# Patient Record
Sex: Female | Born: 1950 | Race: White | Hispanic: No | Marital: Married | Smoking: Never smoker
Health system: Southern US, Community
[De-identification: ages and names within clinical notes are randomized; demographics above are authoritative.]

## PROBLEM LIST (undated history)

## (undated) DIAGNOSIS — Z8042 Family history of malignant neoplasm of prostate: Secondary | ICD-10-CM

## (undated) DIAGNOSIS — Z803 Family history of malignant neoplasm of breast: Secondary | ICD-10-CM

## (undated) DIAGNOSIS — I10 Essential (primary) hypertension: Secondary | ICD-10-CM

## (undated) DIAGNOSIS — Z801 Family history of malignant neoplasm of trachea, bronchus and lung: Secondary | ICD-10-CM

## (undated) HISTORY — PX: TONSILLECTOMY AND ADENOIDECTOMY: SUR1326

## (undated) HISTORY — DX: Family history of malignant neoplasm of breast: Z80.3

## (undated) HISTORY — DX: Family history of malignant neoplasm of prostate: Z80.42

## (undated) HISTORY — PX: PARTIAL HYSTERECTOMY: SHX80

## (undated) HISTORY — DX: Family history of malignant neoplasm of trachea, bronchus and lung: Z80.1

## (undated) HISTORY — DX: Essential (primary) hypertension: I10

---

## 2019-08-14 ENCOUNTER — Other Ambulatory Visit: Payer: Self-pay | Admitting: Family

## 2019-08-14 DIAGNOSIS — N6310 Unspecified lump in the right breast, unspecified quadrant: Secondary | ICD-10-CM

## 2019-08-21 ENCOUNTER — Other Ambulatory Visit: Payer: Self-pay

## 2019-08-21 ENCOUNTER — Ambulatory Visit
Admission: RE | Admit: 2019-08-21 | Discharge: 2019-08-21 | Disposition: A | Discharge status: Home / Self Care | Payer: Medicare Other | Source: Ambulatory Visit | Attending: Family | Admitting: Family

## 2019-08-21 ENCOUNTER — Other Ambulatory Visit: Payer: Self-pay | Admitting: Family

## 2019-08-21 DIAGNOSIS — N6310 Unspecified lump in the right breast, unspecified quadrant: Secondary | ICD-10-CM

## 2019-08-23 ENCOUNTER — Other Ambulatory Visit: Payer: Self-pay | Admitting: *Deleted

## 2019-08-23 ENCOUNTER — Telehealth: Payer: Self-pay | Admitting: Hematology and Oncology

## 2019-08-23 ENCOUNTER — Encounter: Payer: Self-pay | Admitting: *Deleted

## 2019-08-23 DIAGNOSIS — C50411 Malignant neoplasm of upper-outer quadrant of right female breast: Secondary | ICD-10-CM

## 2019-08-23 DIAGNOSIS — Z170 Estrogen receptor positive status [ER+]: Secondary | ICD-10-CM | POA: Insufficient documentation

## 2019-08-23 NOTE — Telephone Encounter (Signed)
Emailed patient to confirm morning [facility] appointment for 11/11, packet emailed to patient

## 2019-08-27 NOTE — Progress Notes (Signed)
[place] NOTE  Patient Care Team: [name], NP as PCP - General (Internal Medicine) Mauro Kaufmann, RN as Oncology Nurse Navigator Rockwell Germany, RN as Oncology Nurse Navigator Nicholas Lose, MD as Consulting Physician (Hematology and Oncology) Gery Pray, MD as Consulting Physician (Radiation Oncology) Alphonsa Overall, MD as Consulting Physician (General Surgery)  CHIEF COMPLAINTS/PURPOSE OF CONSULTATION:  Newly diagnosed breast cancer  HISTORY OF PRESENTING ILLNESS:  Jasmine Sosa 68 y.o. female is here because of recent diagnosis of invasive mammary carcinoma of the right breast. The cancer was detected on a routine screening mammogram on 08/01/19 and was no palpable prior to diagnosis. Diagnostic mammogram and [place] on 08/13/19 showed a 0.8cm mass in the 8:45 position of the right breast with no abnormal right axillary lymph nodes. Biopsy on 08/21/19 showed invasive and in situ mammary carcinoma, grade 1, HER-2 negative (1+), ER+ 90%, PR+ 85%, Ki67 5%. She presents to the clinic today for initial evaluation and discussion of treatment options.   I reviewed her records extensively and collaborated the history with the patient.  SUMMARY OF ONCOLOGIC HISTORY: Oncology History  Malignant neoplasm of upper-outer quadrant of right breast in female, estrogen receptor positive ([place])  08/23/2019 Initial Diagnosis   Routine screening mammogram detected 0.8cm mass in the 8:45 position of the right breast, no abnormal right axillary lymph nodes. 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      MEDICAL HISTORY:  Past Medical History:  Diagnosis Date   Hypertension     SURGICAL HISTORY: Past Surgical History:  Procedure Laterality Date   PARTIAL HYSTERECTOMY     TONSILLECTOMY AND ADENOIDECTOMY      SOCIAL HISTORY: Social History   Socioeconomic History   Marital status: Married   Spouse name: Not on file   Number of children: Not on file   Years of education: Not on file   Highest education level: Not on file  Occupational History   Not on file  Social Needs   Financial resource strain: Not on file   Food insecurity    Worry: Not on file    Inability: Not on file   Transportation needs    Medical: Not on file    Non-medical: Not on file  Tobacco Use   Smoking status: Never Smoker   Smokeless tobacco: Never Used  Substance and Sexual Activity   Alcohol use: Not Currently   Drug use: Never   Sexual activity: Not on file  Lifestyle   Physical activity    Days per week: Not on file    Minutes per session: Not on file   Stress: Not on file  Relationships   Social connections    Talks on phone: Not on file    Gets together: Not on file    Attends religious service: Not on file    Active member of club or organization: Not on file    Attends meetings of clubs or organizations: Not on file    Relationship status: Not on file   Intimate partner violence    Fear of current or ex partner: Not on file    Emotionally abused: Not on file    Physically abused: Not on file    Forced sexual activity: Not on file  Other Topics Concern   Not on file  Social History Narrative   Not on file    FAMILY HISTORY: Family History  Problem Relation Age of Onset   Breast cancer Father    Breast cancer Sister    Breast cancer Paternal Aunt    Lung cancer Paternal Uncle     ALLERGIES:  is allergic to ace inhibitors; penicillins; and sulfa antibiotics.  MEDICATIONS:  Current Outpatient Medications  Medication Sig Dispense Refill   Calcium Carb-Cholecalciferol (CALCIUM 600+D3 PO) Take by mouth.     levothyroxine (SYNTHROID) 75 MCG tablet Take 75 mcg by mouth daily before breakfast.     losartan (COZAAR) 100 MG tablet Take 100 mg by mouth daily.     No current facility-administered medications for this visit.     REVIEW OF SYSTEMS:     Constitutional: Denies fevers, chills or abnormal night sweats Eyes: Denies blurriness of vision, double vision or watery eyes Ears, nose, mouth, throat, and face: Denies mucositis or sore throat Respiratory: Denies cough, dyspnea or wheezes Cardiovascular: Denies palpitation, chest discomfort or lower extremity swelling Gastrointestinal:  Denies nausea, heartburn or change in bowel habits Skin: Denies abnormal skin rashes Lymphatics: Denies new lymphadenopathy or easy bruising Neurological:Denies numbness, tingling or new weaknesses Behavioral/Psych: Mood is stable, no new changes  Breast: Denies any palpable lumps or discharge All other systems were reviewed with the patient and are negative.  PHYSICAL EXAMINATION: ECOG PERFORMANCE STATUS: 1 - Symptomatic but completely ambulatory  Vitals:   08/28/19 0856  BP: (!) 147/79  Pulse: 68  Resp: 18  Temp: 98.5 F (36.9 C)  SpO2: 100%   Filed Weights   08/28/19 0856  Weight: 146 lb 6.4 oz (66.4 kg)    GENERAL:alert, no distress and comfortable SKIN: skin color, texture, turgor are normal, no rashes or significant lesions EYES: normal, conjunctiva are pink and non-injected, sclera clear OROPHARYNX:no exudate, no erythema and lips, buccal mucosa, and tongue normal  NECK: supple, thyroid normal size, non-tender, without nodularity LYMPH:  no palpable lymphadenopathy in the cervical, axillary or inguinal LUNGS: clear to auscultation and percussion with normal breathing effort HEART: regular rate & rhythm and no murmurs and no lower extremity edema ABDOMEN:abdomen soft, non-tender and normal bowel sounds Musculoskeletal:no cyanosis of digits and no clubbing  PSYCH: alert & oriented x 3 with fluent speech NEURO: no focal motor/sensory deficits BREAST: No palpable nodules in breast. No palpable axillary or supraclavicular lymphadenopathy (exam performed in the presence of a chaperone)   LABORATORY DATA:  I have reviewed the data as  listed Lab Results  Component Value Date   WBC 8.8 08/28/2019   HGB 13.3 08/28/2019   HCT 40.5 08/28/2019   MCV 91.2 08/28/2019   PLT 308 08/28/2019   Lab Results  Component Value Date   NA 141 08/28/2019   K 3.8 08/28/2019   CL 104 08/28/2019   CO2 26 08/28/2019    RADIOGRAPHIC STUDIES: I have personally reviewed the radiological reports and agreed with the findings in the report.  ASSESSMENT AND PLAN:  Malignant neoplasm of upper-outer quadrant of right breast in female, estrogen receptor positive ([place]) 08/23/2019:Routine screening mammogram detected 0.8cm mass in the 8:45 position of the right breast, no abnormal right axillary lymph nodes. 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  T1b N0 stage Ia clinical stage  Pathology and radiology counseling: Discussed with the patient, the details of pathology including the type of breast cancer,the clinical staging, the significance of ER, PR and HER-2/neu receptors and the implications for treatment. After reviewing the pathology in detail,  we proceeded to discuss the different treatment options between surgery, radiation, chemotherapy, antiestrogen therapies.  Treatment plan: 1.  Breast conserving surgery with sentinel lymph node biopsy 2.  Adjuvant radiation 3.  Follow-up adjuvant antiestrogen therapy  Based on favorable prognostic panel and grade 1 in the size being less than a centimeter I do not think she needs Oncotype DX testing. Return to clinic after surgery to discuss final pathology report and confirm the adjuvant treatment plan.   All questions were answered. The patient knows to call the clinic with any problems, questions or concerns.   Rulon Eisenmenger, MD, MPH 08/28/2019    I, Molly Dorshimer, am acting as scribe for Nicholas Lose, MD.  I have reviewed the above documentation for accuracy and completeness, and I agree with the above.

## 2019-08-28 ENCOUNTER — Ambulatory Visit (HOSPITAL_BASED_OUTPATIENT_CLINIC_OR_DEPARTMENT_OTHER): Payer: Medicare Other | Admitting: Genetic Counselor

## 2019-08-28 ENCOUNTER — Ambulatory Visit
Admission: RE | Admit: 2019-08-28 | Discharge: 2019-08-28 | Disposition: A | Discharge status: Home / Self Care | Payer: Medicare Other | Source: Ambulatory Visit | Attending: Radiation Oncology | Admitting: Radiation Oncology

## 2019-08-28 ENCOUNTER — Ambulatory Visit: Payer: Medicare Other | Attending: Surgery | Admitting: Physical Therapy

## 2019-08-28 ENCOUNTER — Inpatient Hospital Stay: Payer: Medicare Other

## 2019-08-28 ENCOUNTER — Other Ambulatory Visit: Payer: Self-pay

## 2019-08-28 ENCOUNTER — Encounter: Payer: Self-pay | Admitting: Physical Therapy

## 2019-08-28 ENCOUNTER — Inpatient Hospital Stay: Payer: Medicare Other | Attending: Hematology and Oncology | Admitting: Hematology and Oncology

## 2019-08-28 ENCOUNTER — Encounter: Payer: Self-pay | Admitting: Hematology and Oncology

## 2019-08-28 ENCOUNTER — Encounter: Payer: Self-pay | Admitting: Genetic Counselor

## 2019-08-28 ENCOUNTER — Other Ambulatory Visit: Payer: Self-pay | Admitting: Surgery

## 2019-08-28 DIAGNOSIS — G8929 Other chronic pain: Secondary | ICD-10-CM | POA: Diagnosis present

## 2019-08-28 DIAGNOSIS — Z170 Estrogen receptor positive status [ER+]: Secondary | ICD-10-CM | POA: Insufficient documentation

## 2019-08-28 DIAGNOSIS — C50911 Malignant neoplasm of unspecified site of right female breast: Secondary | ICD-10-CM

## 2019-08-28 DIAGNOSIS — Z801 Family history of malignant neoplasm of trachea, bronchus and lung: Secondary | ICD-10-CM | POA: Diagnosis not present

## 2019-08-28 DIAGNOSIS — C50411 Malignant neoplasm of upper-outer quadrant of right female breast: Secondary | ICD-10-CM

## 2019-08-28 DIAGNOSIS — R293 Abnormal posture: Secondary | ICD-10-CM | POA: Diagnosis present

## 2019-08-28 DIAGNOSIS — Z1379 Encounter for other screening for genetic and chromosomal anomalies: Secondary | ICD-10-CM

## 2019-08-28 DIAGNOSIS — Z8042 Family history of malignant neoplasm of prostate: Secondary | ICD-10-CM

## 2019-08-28 DIAGNOSIS — M25511 Pain in right shoulder: Secondary | ICD-10-CM | POA: Diagnosis present

## 2019-08-28 DIAGNOSIS — Z803 Family history of malignant neoplasm of breast: Secondary | ICD-10-CM

## 2019-08-28 DIAGNOSIS — C50511 Malignant neoplasm of lower-outer quadrant of right female breast: Secondary | ICD-10-CM | POA: Insufficient documentation

## 2019-08-28 LAB — CMP (CANCER CENTER ONLY)
ALT: 13 U/L (ref 0–44)
AST: 16 U/L (ref 15–41)
Albumin: 4.3 g/dL (ref 3.5–5.0)
Alkaline Phosphatase: 81 U/L (ref 38–126)
Anion gap: 11 (ref 5–15)
BUN: 16 mg/dL (ref 8–23)
CO2: 26 mmol/L (ref 22–32)
Calcium: 9.7 mg/dL (ref 8.9–10.3)
Chloride: 104 mmol/L (ref 98–111)
Creatinine: 0.79 mg/dL (ref 0.44–1.00)
GFR, Est AFR Am: >60 mL/min (ref >60)
GFR, Estimated: >60 mL/min (ref >60)
Glucose, Bld: 91 mg/dL (ref 70–99)
Potassium: 3.8 mmol/L (ref 3.5–5.1)
Sodium: 141 mmol/L (ref 135–145)
Total Bilirubin: 0.4 mg/dL (ref 0.3–1.2)
Total Protein: 7.3 g/dL (ref 6.5–8.1)

## 2019-08-28 LAB — CBC WITH DIFFERENTIAL (CANCER CENTER ONLY)
Abs Immature Granulocytes: 0.03 10*3/uL (ref 0.00–0.07)
Basophils Absolute: 0.1 10*3/uL (ref 0.0–0.1)
Basophils Relative: 1 %
Eosinophils Absolute: 0.2 10*3/uL (ref 0.0–0.5)
Eosinophils Relative: 2 %
HCT: 40.5 % (ref 36.0–46.0)
Hemoglobin: 13.3 g/dL (ref 12.0–15.0)
Immature Granulocytes: 0 %
Lymphocytes Relative: 38 %
Lymphs Abs: 3.3 10*3/uL (ref 0.7–4.0)
MCH: 30 pg (ref 26.0–34.0)
MCHC: 32.8 g/dL (ref 30.0–36.0)
MCV: 91.2 fL (ref 80.0–100.0)
Monocytes Absolute: 0.6 10*3/uL (ref 0.1–1.0)
Monocytes Relative: 7 %
Neutro Abs: 4.6 10*3/uL (ref 1.7–7.7)
Neutrophils Relative %: 52 %
Platelet Count: 308 10*3/uL (ref 150–400)
RBC: 4.44 MIL/uL (ref 3.87–5.11)
RDW: 12.6 % (ref 11.5–15.5)
WBC Count: 8.8 10*3/uL (ref 4.0–10.5)
nRBC: 0 % (ref 0.0–0.2)

## 2019-08-28 NOTE — Assessment & Plan Note (Signed)
08/23/2019:Routine screening mammogram detected 0.8cm mass in the 8:45 position of the right breast, no abnormal right axillary lymph nodes. 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  T1b N0 stage Ia clinical stage  Pathology and radiology counseling: Discussed with the patient, the details of pathology including the type of breast cancer,the clinical staging, the significance of ER, PR and HER-2/neu receptors and the implications for treatment. After reviewing the pathology in detail, we proceeded to discuss the different treatment options between surgery, radiation, chemotherapy, antiestrogen therapies.  Treatment plan: 1.  Breast conserving surgery with sentinel lymph node biopsy 2.  Adjuvant radiation 3.  Follow-up adjuvant antiestrogen therapy  Based on favorable prognostic panel and grade 1 in the size being less than a centimeter I do not think she needs Oncotype DX testing. Return to clinic after surgery to discuss final pathology report and confirm the adjuvant treatment plan.

## 2019-08-28 NOTE — Progress Notes (Signed)
Radiation Oncology         [phone] ________________________________  Multidisciplinary Breast Oncology Clinic [facility]) Initial Outpatient Consultation  Name: Jasmine Sosa MRN: [id number]  Date: 08/28/2019  DOB: 1951-06-29  IR:JJOAC, Felipa Evener, NP  Alphonsa Overall, MD   REFERRING PHYSICIAN: Alphonsa Overall, MD  DIAGNOSIS: The encounter diagnosis was Malignant neoplasm of upper-outer quadrant of right breast in female, estrogen receptor positive ([place]).  Stage 1A (T1b, CN0), Right Breast LOQ, Invasive Ductal Carcinoma, ER+ / PR+ / Her2-, Grade 1.    ICD-10-CM   1. Malignant neoplasm of upper-outer quadrant of right breast in female, estrogen receptor positive ([place])  C50.411    Z17.0     HISTORY OF PRESENT ILLNESS::Jasmine Sosa is a 68 y.o. female who is presenting to the office today for evaluation of her newly diagnosed breast cancer. She is accompanied by her husband. She is doing well overall.   She had routine screening mammography on 08/01/2019 showing a possible abnormality in the right breast. She underwent right diagnostic mammography with tomography and right breast ultrasonography at [facility] on 08/13/2019 showing 8 mm mass in the 8:45 o'clock position of the right breast with imaging features highly suspicious for malignancy.  Biopsy on 08/21/2019 showed invasive and in situ mammary carcinoma; E-cadherin positive. Prognostic indicators significant for: estrogen receptor, 90% positive and progesterone receptor, 85% positive, both with strong staining intensity. Proliferation marker Ki67 at 5%. HER2 positive.  Menarche: 68 years old Age at first live birth: 68 years old GP: 2 LMP: Unknown Contraceptive: Yes; used for approximately two years HRT: Yes; Estradiol for two weeks - stopped on 07/25/2019.   The patient was referred today for presentation in the multidisciplinary conference.  Radiology studies and pathology slides were presented there for review and  discussion of treatment options.  A consensus was discussed regarding potential next steps.  PREVIOUS RADIATION THERAPY: No  PAST MEDICAL HISTORY:  Past Medical History:  Diagnosis Date   Hypertension     PAST SURGICAL HISTORY: Past Surgical History:  Procedure Laterality Date   PARTIAL HYSTERECTOMY     TONSILLECTOMY AND ADENOIDECTOMY      FAMILY HISTORY:  Family History  Problem Relation Age of Onset   Breast cancer Father    Breast cancer Sister    Breast cancer Paternal Aunt    Lung cancer Paternal Uncle     SOCIAL HISTORY:  Social History   Socioeconomic History   Marital status: Married    Spouse name: Not on file   Number of children: Not on file   Years of education: Not on file   Highest education level: Not on file  Occupational History   Not on file  Social Needs   Financial resource strain: Not on file   Food insecurity    Worry: Not on file    Inability: Not on file   Transportation needs    Medical: Not on file    Non-medical: Not on file  Tobacco Use   Smoking status: Never Smoker   Smokeless tobacco: Never Used  Substance and Sexual Activity   Alcohol use: Not Currently   Drug use: Never   Sexual activity: Not on file  Lifestyle   Physical activity    Days per week: Not on file    Minutes per session: Not on file   Stress: Not on file  Relationships   Social connections    Talks on phone: Not on file    Gets together: Not on file  Attends religious service: Not on file    Active member of club or organization: Not on file    Attends meetings of clubs or organizations: Not on file    Relationship status: Not on file  Other Topics Concern   Not on file  Social History Narrative   Not on file    ALLERGIES:  Allergies  Allergen Reactions   Ace Inhibitors Rash   Penicillins Rash   Sulfa Antibiotics Rash    MEDICATIONS:  Current Outpatient Medications  Medication Sig Dispense Refill   Calcium  Carb-Cholecalciferol (CALCIUM 600+D3 PO) Take by mouth.     levothyroxine (SYNTHROID) 75 MCG tablet Take 75 mcg by mouth daily before breakfast.     losartan (COZAAR) 100 MG tablet Take 100 mg by mouth daily.     No current facility-administered medications for this encounter.     REVIEW OF SYSTEMS: A 10+ POINT REVIEW OF SYSTEMS WAS OBTAINED including neurology, dermatology, psychiatry, cardiac, respiratory, lymph, extremities, GI, GU, musculoskeletal, constitutional, reproductive, HEENT. On the provided form, she reports cramping pain in bilateral upper arms, change in stool habits, UTI, rectal bleeding from hemorrhoids due to UTI medication, and anxiety. History of skin cancer. Wears glasses. She denies breast pain and any other symptoms.    PHYSICAL EXAM:   Vitals with BMI 08/28/2019  Height 5' 1"   Weight 146 lbs 6 oz  BMI 88.41  Systolic 660  Diastolic 79  Pulse 68   Lungs are clear to auscultation bilaterally. Heart has regular rate and rhythm. No palpable cervical, supraclavicular, or axillary adenopathy. Abdomen soft, non-tender, normal bowel sounds. Breast: left breast with no palpable mass, nipple discharge, or bleeding.  Right breast with bruising in lower outer quadrant. No palpable mass, discharge, or bleeding.  ECOG = 1  0 - Asymptomatic (Fully active, able to carry on all predisease activities without restriction)  1 - Symptomatic but completely ambulatory (Restricted in physically strenuous activity but ambulatory and able to carry out work of a light or sedentary nature. For example, light housework, office work)  2 - Symptomatic, <50% in bed during the day (Ambulatory and capable of all self care but unable to carry out any work activities. Up and about more than 50% of waking hours)  3 - Symptomatic, >50% in bed, but not bedbound (Capable of only limited self-care, confined to bed or chair 50% or more of waking hours)  4 - Bedbound (Completely disabled. Cannot  carry on any self-care. Totally confined to bed or chair)  5 - Death   Eustace Pen MM, Creech RH, Tormey DC, et al. [phone]). "Toxicity and response criteria of the [facility]". [place] Oncol. 5 (6): 649-55  LABORATORY DATA:  Lab Results  Component Value Date   WBC 8.8 08/28/2019   HGB 13.3 08/28/2019   HCT 40.5 08/28/2019   MCV 91.2 08/28/2019   PLT 308 08/28/2019   Lab Results  Component Value Date   NA 141 08/28/2019   K 3.8 08/28/2019   CL 104 08/28/2019   CO2 26 08/28/2019   Lab Results  Component Value Date   ALT 13 08/28/2019   AST 16 08/28/2019   ALKPHOS 81 08/28/2019   BILITOT 0.4 08/28/2019    PULMONARY FUNCTION TEST:   Recent Review Flowsheet Data    There is no flowsheet data to display.      RADIOGRAPHY: Mm Clip Placement Right  Result Date: 08/21/2019 CLINICAL DATA:  Ultrasound-guided core needle biopsy was performed of  a suspicious mass in the lower outer quadrant of the right breast. EXAM: DIAGNOSTIC RIGHT MAMMOGRAM POST ULTRASOUND BIOPSY COMPARISON:  Previous exam(s). FINDINGS: Mammographic images were obtained following ultrasound guided biopsy of a right breast mass. The biopsy marking clip is in expected position at the site of biopsy. IMPRESSION: Appropriate positioning of the ribbon shaped biopsy marking clip at the site of biopsy in the 8:45 position of the right breast. Final Assessment: Post Procedure Mammograms for Marker Placement Electronically Signed   By: Curlene Dolphin M.D.   On: 08/21/2019 15:49   [place] Rt Breast Bx W Loc Dev 1st Lesion Img Bx Spec US Guide  Addendum Date: 08/23/2019   ADDENDUM REPORT: 08/23/2019 07:55 ADDENDUM: Pathology revealed GRADE I INVASIVE AND IN SITU MAMMARY CARCINOMA of the Right breast, 8:45 o'clock position, 5 cmfn. This was found to be concordant by Dr. Curlene Dolphin. Pathology results were discussed with the patient by telephone by Marrianne Mood, NP, at [facility] in [place], [place],  per request. I called the patient and she reported doing well after the biopsy with tenderness at the site. Post biopsy instructions and care were reviewed and questions were answered. The patient was encouraged to call [facility] for any additional concerns. The patient was referred to [facility] at [facility] on August 28, 2019. Pathology results reported by Terie Purser, RN on 08/23/2019. Electronically Signed   By: Curlene Dolphin M.D.   On: 08/23/2019 07:55   Result Date: 08/23/2019 CLINICAL DATA:  Ultrasound-guided core needle biopsy was recommended of a mass in the 8:45 position of the right breast 5 cm from the nipple. EXAM: ULTRASOUND GUIDED RIGHT BREAST CORE NEEDLE BIOPSY COMPARISON:  Previous exam(s). FINDINGS: I met with the patient and we discussed the procedure of ultrasound-guided biopsy, including benefits and alternatives. We discussed the high likelihood of a successful procedure. We discussed the risks of the procedure, including infection, bleeding, tissue injury, clip migration, and inadequate sampling. Informed written consent was given. The usual time-out protocol was performed immediately prior to the procedure. Lesion quadrant: Lower outer quadrant Using sterile technique and 1% Lidocaine as local anesthetic, under direct ultrasound visualization, a 12 gauge spring-loaded device was used to perform biopsy of an irregular hypoechoic mass at 8:45 position 5 cm from the nipple using a lateral approach. At the conclusion of the procedure ribbon tissue marker clip was deployed into the biopsy cavity. Follow up 2 view mammogram was performed and dictated separately. IMPRESSION: Ultrasound guided biopsy of the right breast. No apparent complications. Electronically Signed: By: Curlene Dolphin M.D. On: 08/21/2019 15:48      IMPRESSION: Stage 1A (T1b, CN0), Right Breast LOQ, Invasive Ductal Carcinoma, ER+ /  PR+ / Her2-, Grade 1.  Patient will be a good candidate for breast conservation with radiotherapy to right breast. We discussed the general course of radiation, potential side effects, and toxicities with radiation and the patient is interested in this approach. She lives in the [place] area and may wish to have radiation therapy at the [facility] in [place]. She will make this decision at a later date.   PLAN:  1. Genetics 2. Right lumpectomy with SLN 3. Adjuvant radiation therapy 4. Aromatase Inhibitor   ------------------------------------------------  Blair Promise, PhD, MD  This document serves as a record of services personally performed by Gery Pray, MD. It was created on his behalf by Clerance Lav, a  trained medical scribe. The creation of this record is based on the scribe's personal observations and the provider's statements to them. This document has been checked and approved by the attending provider.

## 2019-08-28 NOTE — Therapy (Addendum)
[place], [place], [address] Phone: [phone]   Fax:  [phone]  Physical Therapy Evaluation  Patient Details  Name: Jasmine Sosa MRN: [id number] Date of Birth: 17-May-1951 Referring Provider (PT): Dr. Alphonsa Overall   Encounter Date: 08/28/2019  PT End of Session - 08/28/19 1002    Visit Number  1    Number of Visits  2    Date for PT Re-Evaluation  10/23/19    PT Start Time  0935    PT Stop Time  0950   Also saw pt from 1010-1017 for a total of 32 minutes   PT Time Calculation (min)  15 min    Activity Tolerance  Patient tolerated treatment well    Behavior During Therapy  [facility] for tasks assessed/performed       Past Medical History:  Diagnosis Date  . Hypertension     Past Surgical History:  Procedure Laterality Date  . PARTIAL HYSTERECTOMY    . TONSILLECTOMY AND ADENOIDECTOMY      There were no vitals filed for this visit.   Subjective Assessment - 08/28/19 0955    Subjective  Patient reports she is here today to be seen for her newly diagnosed right breast cancer.    Patient is accompained by:  Family member    Pertinent History  Patient was diagnosed on 08/01/2019 with right grade I invasive ductal carcinoma breast cancer. It measures 8 mm and is located in the lower outer quadrant. It is ER/PR positive and HER2 negative with a Ki67 of 5%. She has a history of right shoulder pain.    Currently in Pain?  Yes    Pain Score  --   Varies but can be severe; none today   Pain Location  Shoulder    Pain Orientation  Right    Pain Descriptors / Indicators  Aching    Pain Type  Chronic pain    Pain Onset  More than a month ago    Pain Frequency  Intermittent    Aggravating Factors   Stressing the joint with repetive activity or taking it into a painful ROM    Pain Relieving Factors  rest         [facility] PT Assessment - 08/28/19 0001      Assessment   Medical Diagnosis  Right breast  cancer    Referring Provider (PT)  Dr. Alphonsa Overall    Onset Date/Surgical Date  08/01/19    Hand Dominance  Right    Prior Therapy  none      Precautions   Precautions  Other (comment)    Precaution Comments  active cancer      Restrictions   Weight Bearing Restrictions  No      Balance Screen   Has the patient fallen in the past 6 months  No    Has the patient had a decrease in activity level because of a fear of falling?   No    Is the patient reluctant to leave their home because of a fear of falling?   No      Home Environment   Living Environment  Private residence    Living Arrangements  Spouse/significant other    Available Help at Discharge  Family      Prior Function   Level of [place]  Retired    Leisure  She walks daily for 30 min  Cognition   Overall Cognitive Status  Within Functional Limits for tasks assessed      Posture/Postural Control   Posture/Postural Control  Postural limitations    Postural Limitations  Rounded Shoulders;Forward head      ROM / Strength   AROM / PROM / Strength  AROM      AROM   AROM Assessment Site  Shoulder    Right/Left Shoulder  Right;Left    Right Shoulder Extension  36 Degrees    Right Shoulder Flexion  150 Degrees    Right Shoulder ABduction  144 Degrees    Right Shoulder Internal Rotation  57 Degrees    Right Shoulder External Rotation  74 Degrees    Left Shoulder Extension  43 Degrees    Left Shoulder Flexion  152 Degrees    Left Shoulder ABduction  150 Degrees    Left Shoulder Internal Rotation  55 Degrees    Left Shoulder External Rotation  67 Degrees        LYMPHEDEMA/ONCOLOGY QUESTIONNAIRE - 08/28/19 1000      Type   Cancer Type  Right breast cancer      Lymphedema Assessments   Lymphedema Assessments  Upper extremities      Right Upper Extremity Lymphedema   10 cm Proximal to Olecranon Process  29.1 cm    Olecranon Process  24.2 cm    10 cm Proximal to Ulnar  Styloid Process  21.8 cm    Just Proximal to Ulnar Styloid Process  14.6 cm    Across Hand at [employer]  18 cm    At [place] of 2nd Digit  6.1 cm      Left Upper Extremity Lymphedema   10 cm Proximal to Olecranon Process  27.6 cm    Olecranon Process  23.9 cm    10 cm Proximal to Ulnar Styloid Process  20.2 cm    Just Proximal to Ulnar Styloid Process  14.6 cm    Across Hand at [employer]  17.6 cm    At [place] of 2nd Digit  5.9 cm          Quick Dash - 08/28/19 0001    Open a tight or new jar  No difficulty    Do heavy household chores (wash walls, wash floors)  No difficulty    Carry a shopping bag or briefcase  No difficulty    Wash your back  No difficulty    Use a knife to cut food  No difficulty    Recreational activities in which you take some force or impact through your arm, shoulder, or hand (golf, hammering, tennis)  No difficulty    During the past week, to what extent has your arm, shoulder or hand problem interfered with your normal social activities with family, friends, neighbors, or groups?  Not at all    During the past week, to what extent has your arm, shoulder or hand problem limited your work or other regular daily activities  Slightly    Arm, shoulder, or hand pain.  Moderate    Tingling (pins and needles) in your arm, shoulder, or hand  None    Difficulty Sleeping  No difficulty    DASH Score  6.82 %        Objective measurements completed on examination: See above findings.      Patient was instructed today in a home exercise program today for post op shoulder range of motion. These included active assist  shoulder flexion in sitting, scapular retraction, wall walking with shoulder abduction, and hands behind head external rotation.  She was encouraged to do these twice a day, holding 3 seconds and repeating 5 times when permitted by her physician.            PT Education - 08/28/19 1000    Education Details  Lymphedema risk reduction  and post op shoulder ROM HEP    Person(s) Educated  Patient    Methods  Explanation;Demonstration;Handout    Comprehension  Verbalized understanding;Returned demonstration          PT Long Term Goals - 08/28/19 1047      PT LONG TERM GOAL #1   Title  Patient will demonstrate she has regained full shoulder ROM and function post operatively compared to baselines.    Time  8    Period  Weeks    Status  New    Target Date  10/23/19      Breast Clinic Goals - 08/28/19 1047      Patient will be able to verbalize understanding of pertinent lymphedema risk reduction practices relevant to her diagnosis specifically related to skin care.   Time  1    Period  Days    Status  Achieved      Patient will be able to return demonstrate and/or verbalize understanding of the post-op home exercise program related to regaining shoulder range of motion.   Time  1    Period  Days    Status  Achieved      Patient will be able to verbalize understanding of the importance of attending the postoperative After Breast Cancer Class for further lymphedema risk reduction education and therapeutic exercise.   Time  1    Period  Days    Status  Achieved            Plan - 08/28/19 1003    Clinical Impression Statement  Patient was diagnosed on 08/01/2019 with right grade I invasive ductal carcinoma breast cancer. It measures 8 mm and is located in the lower outer quadrant. It is ER/PR positive and HER2 negative with a Ki67 of 5%. She has a history of right shoulder pain. Her multidisciplinary medical team met prior ot her assessments to determine a recommended treatment plan. She is planning to have a right lumpectomy and sentinel node biopsy followed by radiation and anti-estrogen therapy.She will benefit from a post op PT reassessment to determine needs.    Stability/Clinical Decision Making  Stable/Uncomplicated    Clinical Decision Making  Low    Rehab Potential  Excellent    PT Frequency  --    Eval and 1 f/u visit   PT Treatment/Interventions  ADLs/Self Care Home Management;Therapeutic exercise;Patient/family education    PT Next Visit Plan  Will reassess 3-4 weeks post op to determine needs    PT Home Exercise Plan  Post op shoulder ROM HEP    Consulted and Agree with Plan of Care  Patient;Family member/caregiver    Family Member Consulted  Family       Patient will benefit from skilled therapeutic intervention in order to improve the following deficits and impairments:  Postural dysfunction, Decreased range of motion, Impaired UE functional use, Pain, Decreased knowledge of precautions  Visit Diagnosis: Malignant neoplasm of lower-outer quadrant of right breast of female, estrogen receptor positive ([place]) - Plan: PT plan of care cert/re-cert  Abnormal posture - Plan: PT plan of care cert/re-cert  Chronic right shoulder  pain - Plan: PT plan of care cert/re-cert   Patient will follow up at outpatient cancer rehab 3-4 weeks following surgery.  If the patient requires physical therapy at that time, a specific plan will be dictated and sent to the referring physician for approval. The patient was educated today on appropriate basic range of motion exercises to begin post operatively and the importance of attending the After Breast Cancer class following surgery.  Patient was educated today on lymphedema risk reduction practices as it pertains to recommendations that will benefit the patient immediately following surgery.  She verbalized good understanding.     Problem List Patient Active Problem List   Diagnosis Date Noted  . Malignant neoplasm of upper-outer quadrant of right breast in female, estrogen receptor positive ([place]) 08/23/2019   Annia Friendly, PT 08/28/19 10:53 AM  [place], [place], [address] Phone: [phone]   Fax:  [phone]  Name: Jasmine Sosa MRN: [id number] Date of  Birth: [birth year]

## 2019-08-28 NOTE — Progress Notes (Signed)
REFERRING PROVIDER: Nicholas Lose, MD [place],  [place] [id number]  PRIMARY PROVIDER:  [name], NP  PRIMARY REASON FOR VISIT:  1. Malignant neoplasm of upper-outer quadrant of right breast in female, estrogen receptor positive ([place])   2. Family history of breast cancer   3. Family history of prostate cancer   4. Family history of lung cancer      I connected with Jasmine Sosa on 08/28/2019 at 11:20 am EDT by [employer] video conference and verified that I am speaking with the correct person using two identifiers.   Patient location: clinic Provider location: office  HISTORY OF PRESENT ILLNESS:   Jasmine Sosa, a 68 y.o. female, was seen for a [place] cancer genetics consultation at the request of Dr. Lindi Adie due to a personal and family history of breast cancer.  Jasmine Sosa presents to clinic today to discuss the possibility of a hereditary predisposition to cancer, genetic testing, and to further clarify her future cancer risks, as well as potential cancer risks for family members.   In 2020, at the age of 14, Jasmine Sosa was diagnosed with invasive and in situ mammary carcinoma, ER+/PR+/Her2-, of the right breast. The treatment plan includes surgery, adjuvant radiation, and adjuvant antiestrogen therapy.   CANCER HISTORY:  Oncology History  Malignant neoplasm of upper-outer quadrant of right breast in female, estrogen receptor positive ([place])  08/23/2019 Initial Diagnosis   Routine screening mammogram detected 0.8cm mass in the 8:45 position of the right breast, no abnormal right axillary lymph nodes. 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       RISK FACTORS:  Menarche was at age 9.  First live birth at age 84.  OCP use for approximately 1 years.  Ovaries intact: yes.  Hysterectomy: yes.  Menopausal status: unsure.  HRT use: 0 years. Colonoscopy: yes; normal in 2015, per patient.. Mammogram  within the last year: yes. Number of breast biopsies: 2. Any excessive radiation exposure in the past: no  Past Medical History:  Diagnosis Date   Family history of breast cancer    Family history of lung cancer    Family history of prostate cancer    Hypertension     Past Surgical History:  Procedure Laterality Date   PARTIAL HYSTERECTOMY     TONSILLECTOMY AND ADENOIDECTOMY      Social History   Socioeconomic History   Marital status: Married    Spouse name: Not on file   Number of children: Not on file   Years of education: Not on file   Highest education level: Not on file  Occupational History   Not on file  Social Needs   Financial resource strain: Not on file   Food insecurity    Worry: Not on file    Inability: Not on file   Transportation needs    Medical: Not on file    Non-medical: Not on file  Tobacco Use   Smoking status: Never Smoker   Smokeless tobacco: Never Used  Substance and Sexual Activity   Alcohol use: Not Currently   Drug use: Never   Sexual activity: Not on file  Lifestyle   Physical activity    Days per week: Not on file    Minutes per session: Not on file   Stress: Not on file  Relationships   Social connections    Talks on phone: Not on file  Gets together: Not on file    Attends religious service: Not on file    Active member of club or organization: Not on file    Attends meetings of clubs or organizations: Not on file    Relationship status: Not on file  Other Topics Concern   Not on file  Social History Narrative   Not on file     FAMILY HISTORY:  We obtained a detailed, 4-generation family history.  Significant diagnoses are listed below: Family History  Problem Relation Age of Onset   Breast cancer Father 22       normal genetic testing in 2011   Breast cancer Sister 4       second diagnosis in other breast at age 75   Breast cancer Paternal Aunt 10   Lung cancer Paternal Uncle     Other Brother        brain tumor in his 34s   Lung cancer Maternal Uncle        smoker   Aneurysm Maternal Grandmother    Prostate cancer Paternal Uncle    Cancer Paternal Uncle        unknown type   Jasmine Sosa has two children: a son who is 65 and a daughter who is 88. She has three brothers and two sisters. One of her sisters had breast cancer diagnosed in one breast at age 10, and breast cancer diagnosed in the other breast at age 34. Jasmine Sosa does not believe her sister has had genetic testing. One of her brothers had a brain tumor at the base of his brain in his 44s that went away on its own.   Jasmine Sosa mother is currently 44 and has not had cancer. Of her four maternal uncles and two maternal aunts, she has one uncle who had lung cancer. Her grandparents died in their 73s without cancer. There are no other known diagnoses of cancer on the maternal side of the family.  Jasmine Sosa father died at the age of 19 from breast cancer that was diagnosed a year before. Her father had genetic testing in 2011 that was negative, although the report was not available for review at today's visit. She had four paternal uncles and four paternal aunts. One of her aunts had breast cancer at age 66. One uncle had lung cancer, another uncle had prostate cancer, and a third uncle had an unknown type of cancer. Her paternal grandparents died in their 2s or 1s without cancer. There are no other known diagnoses of cancer on the paternal side of the family.  Jasmine Sosa is aware of previous family history of genetic testing for hereditary cancer risks in her father. She is unsure of her ancestry. There is no reported Ashkenazi Jewish ancestry. There is no known consanguinity.  GENETIC COUNSELING ASSESSMENT: Jasmine Sosa is a 68 y.o. female with a personal and family history of breast cancer which is somewhat suggestive of a hereditary cancer syndrome and predisposition to cancer. We, therefore, discussed and recommended  the following at today's visit.   DISCUSSION: Approximately 5-10% of breast cancer is hereditary, with most cases associated with BRCA1/2.  There are other genes that can be associated with hereditary breast cancer syndromes.  These include ATM, CHEK2, PALB2, etc.  We discussed that testing is beneficial for several reasons including knowing about other cancer risks, identifying potential screening and risk-reduction options that may be appropriate, and to understand if other family members could be at risk for cancer  and allow them to undergo genetic testing.   We reviewed the characteristics, features and inheritance patterns of hereditary cancer syndromes. We also discussed genetic testing, including the appropriate family members to test, the process of testing, insurance coverage and turn-around-time for results. We discussed the implications of a negative, positive and/or variant of uncertain significant result. In order to get genetic test results in a timely manner so that Jasmine Sosa can use these genetic test results for surgical decisions, we recommended Jasmine Sosa pursue genetic testing for the [facility] Breast Cancer STAT panel. Once complete, we recommend Jasmine Sosa pursue reflex genetic testing to the Common Hereditary Cancers gene panel.   The STAT Breast cancer panel offered by [facility] includes sequencing and rearrangement analysis for the following 9 genes:  ATM, BRCA1, BRCA2, CDH1, CHEK2, PALB2, PTEN, STK11 and TP53.     The Common Hereditary Cancers Panel offered by [facility] includes sequencing and/or deletion duplication testing of the following 48 genes: APC, ATM, AXIN2, BARD1, BMPR1A, BRCA1, BRCA2, BRIP1, CDH1, CDK4, CDKN2A (p14ARF), CDKN2A (p16INK4a), CHEK2, CTNNA1, DICER1, EPCAM (Deletion/duplication testing only), GREM1 (promoter region deletion/duplication testing only), KIT, MEN1, MLH1, MSH2, MSH3, MSH6, MUTYH, NBN, NF1, NHTL1, PALB2, PDGFRA, PMS2, POLD1, POLE, PTEN, RAD50, RAD51C, RAD51D,  RNF43, SDHB, SDHC, SDHD, SMAD4, SMARCA4. STK11, TP53, TSC1, TSC2, and VHL.  The following genes were evaluated for sequence changes only: SDHA and HOXB13 c.251G>A variant only.   Based on Ms. Sporn's personal and family history of cancer, she meets medical criteria for genetic testing. Despite that she meets criteria, she may still have an out of pocket cost.   PLAN: After considering the risks, benefits, and limitations, Jasmine Sosa provided informed consent to pursue genetic testing and the blood sample was sent to [facility] for analysis of the Breast Cancer STAT panel + Common Hereditary Cancers panel. Results should be available within approximately one weeks' time, at which point they will be disclosed by telephone to Jasmine Sosa, as will any additional recommendations warranted by these results. Jasmine Sosa will receive a summary of her genetic counseling visit and a copy of her results once available. This information will also be available in Epic.   Jasmine Sosa questions were answered to her satisfaction today. Our contact information was provided should additional questions or concerns arise. Thank you for the referral and allowing [place] to share in the care of your patient.   Jasmine Guy, MS, [facility] Certified Genetic Counselor [email or web address] Phone: [phone]  The patient was seen for a total of 20 minutes in face-to-face genetic counseling.  This patient was discussed with Drs. Magrinat, Lindi Adie and/or Burr Medico who agrees with the above.    _______________________________________________________________________ For Office Staff:  Number of people involved in session: 1 Was an Intern/ student involved with case: no

## 2019-08-28 NOTE — Patient Instructions (Signed)

## 2019-08-29 ENCOUNTER — Telehealth: Payer: Self-pay | Admitting: *Deleted

## 2019-08-29 NOTE — Telephone Encounter (Signed)
Spoke to pt concerning [place] from 08/28/19. Denies questions or concerns regarding dx or treatment care plan. Pt discussed that if genetics come back positive she will think of having bil mast. Discussed next steps in tx plan. Encourage pt to call with needs. Received verbal understanding.

## 2019-08-30 ENCOUNTER — Other Ambulatory Visit: Payer: Self-pay | Admitting: Surgery

## 2019-08-30 ENCOUNTER — Telehealth: Payer: Self-pay | Admitting: Hematology and Oncology

## 2019-08-30 DIAGNOSIS — Z170 Estrogen receptor positive status [ER+]: Secondary | ICD-10-CM

## 2019-08-30 DIAGNOSIS — C50911 Malignant neoplasm of unspecified site of right female breast: Secondary | ICD-10-CM

## 2019-08-30 NOTE — Telephone Encounter (Signed)
Scheduled appt per 11/13 sch message- pt is aware of appt date and time

## 2019-09-03 ENCOUNTER — Telehealth: Payer: Self-pay

## 2019-09-03 DIAGNOSIS — Z1379 Encounter for other screening for genetic and chromosomal anomalies: Secondary | ICD-10-CM | POA: Insufficient documentation

## 2019-09-03 NOTE — Telephone Encounter (Signed)
Nutrition Assessment  Reason for Assessment:  Pt attended Breast Clinic on 08/28/2019 and was given nutrition packet by nurse navigator.    ASSESSMENT: 68 year old female with new diagnosis of right breast cancer.  Planning lumpectomy followed by radiation and antiestrogens.  Spoke with patient via phone to introduce self and service at [facility].  Patient reports normal appetite and eating well.   Medications:  reviewed  Labs: reviewed  Anthropometrics:   Height: 61 inches Weight: 146 lb BMI: 27   NUTRITION DIAGNOSIS: Food and nutrition related knowledge deficit related to new diagnosis of breast cancer as evidenced by no prior need for nutrition related information.  INTERVENTION:   Discussed briefly packet of information regarding nutritional tips for breast cancer patients.  No questions at this time. Contact information provided and patient knows to contact me with questions/concerns.    MONITORING, EVALUATION, and GOAL: Pt will consume a healthy plant based diet to maintain lean body mass throughout treatment.   Sa[place]vik Thielman B. Zenia Resides, [place], [place] Registered Dietitian [phone] (pager)

## 2019-09-04 ENCOUNTER — Encounter: Payer: Self-pay | Admitting: Genetic Counselor

## 2019-09-04 ENCOUNTER — Telehealth: Payer: Self-pay | Admitting: Genetic Counselor

## 2019-09-04 ENCOUNTER — Ambulatory Visit: Payer: Self-pay | Admitting: Genetic Counselor

## 2019-09-04 DIAGNOSIS — Z1379 Encounter for other screening for genetic and chromosomal anomalies: Secondary | ICD-10-CM

## 2019-09-04 NOTE — Telephone Encounter (Signed)
Revealed negative genetic testing.  Discussed that we do not know why she has breast cancer or why there is cancer in the family.  It is possible that there is a hereditary cause in the family that she did not inherit.  It could also be due to a different gene that we are not testing, or our current technology may not be able detect something.  It will be important for her to keep in contact with genetics to keep up with whether additional testing may be appropriate in the future.

## 2019-09-04 NOTE — Progress Notes (Signed)
HPI:  Ms. Broadwell was previously seen in the [place] clinic due to a personal and family history of breast cancer and concerns regarding a hereditary predisposition to cancer. Please refer to our prior cancer genetics clinic note for more information regarding our discussion, assessment and recommendations, at the time. Ms. Obie recent genetic test results were disclosed to her, as were recommendations warranted by these results. These results and recommendations are discussed in more detail below.  CANCER HISTORY:  Oncology History  Malignant neoplasm of upper-outer quadrant of right breast in female, estrogen receptor positive ([place])  08/23/2019 Initial Diagnosis   Routine screening mammogram detected 0.8cm mass in the 8:45 position of the right breast, no abnormal right axillary lymph nodes. 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    09/03/2019 Genetic Testing   Negative genetic testing:  No pathogenic variants identified on the [facility] Breast Cancer STAT panel or the Common Hereditary Cancers panel. The report date is 09/03/2019.  The Breast Cancer STAT panel offered by [facility] includes sequencing and rearrangement analysis for the following 9 genes:  ATM, BRCA1, BRCA2, CDH1, CHEK2, PALB2, PTEN, STK11 and TP53.  The Common Hereditary Cancers Panel offered by [facility] includes sequencing and/or deletion duplication testing of the following 48 genes: APC, ATM, AXIN2, BARD1, BMPR1A, BRCA1, BRCA2, BRIP1, CDH1, CDK4, CDKN2A (p14ARF), CDKN2A (p16INK4a), CHEK2, CTNNA1, DICER1, EPCAM (Deletion/duplication testing only), GREM1 (promoter region deletion/duplication testing only), KIT, MEN1, MLH1, MSH2, MSH3, MSH6, MUTYH, NBN, NF1, NHTL1, PALB2, PDGFRA, PMS2, POLD1, POLE, PTEN, RAD50, RAD51C, RAD51D, RNF43, SDHB, SDHC, SDHD, SMAD4, SMARCA4. STK11, TP53, TSC1, TSC2, and VHL.  The following genes were evaluated for sequence changes  only: SDHA and HOXB13 c.251G>A variant only.     FAMILY HISTORY:  We obtained a detailed, 4-generation family history.  Significant diagnoses are listed below: Family History  Problem Relation Age of Onset   Breast cancer Father 32       normal genetic testing in 2011   Breast cancer Sister 90       second diagnosis in other breast at age 17   Breast cancer Paternal Aunt 71   Lung cancer Paternal Uncle    Other Brother        brain tumor in his 75s   Lung cancer Maternal Uncle        smoker   Aneurysm Maternal Grandmother    Prostate cancer Paternal Uncle    Cancer Paternal Uncle        unknown type    Ms. Diskin has two children: a son who is 10 and a daughter who is 34. She has three brothers and two sisters. One of her sisters had breast cancer diagnosed in one breast at age 48, and breast cancer diagnosed in the other breast at age 40. Ms. Markert does not believe her sister has had genetic testing. One of her brothers had a brain tumor at the base of his brain in his 33s that went away on its own.   Ms. Hiser mother is currently 33 and has not had cancer. Of her four maternal uncles and two maternal aunts, she has one uncle who had lung cancer. Her grandparents died in their 85s without cancer. There are no other known diagnoses of cancer on the maternal side of the family.  Ms. Urbanski father died at the age of 97 from breast cancer that was diagnosed a year before. Her father  had genetic testing in 2011 that was negative, although the report was not available for review at today's visit. She had four paternal uncles and four paternal aunts. One of her aunts had breast cancer at age 39. One uncle had lung cancer, another uncle had prostate cancer, and a third uncle had an unknown type of cancer. Her paternal grandparents died in their 42s or 10s without cancer. There are no other known diagnoses of cancer on the paternal side of the family.  Ms. Bermingham is aware of previous  family history of genetic testing for hereditary cancer risks in her father. She is unsure of her ancestry. There is no reported Ashkenazi Jewish ancestry. There is no known consanguinity.  GENETIC TEST RESULTS: Genetic testing reported out on 09/03/2019 through the [facility] Breast Cancer STAT panel and Common Hereditary Cancers panel found no pathogenic variants.   The STAT Breast cancer panel offered by [facility] includes sequencing and rearrangement analysis for the following 9 genes:  ATM, BRCA1, BRCA2, CDH1, CHEK2, PALB2, PTEN, STK11 and TP53.  The Common Hereditary Cancers Panel offered by [facility] includes sequencing and/or deletion duplication testing of the following 48 genes: APC, ATM, AXIN2, BARD1, BMPR1A, BRCA1, BRCA2, BRIP1, CDH1, CDK4, CDKN2A (p14ARF), CDKN2A (p16INK4a), CHEK2, CTNNA1, DICER1, EPCAM (Deletion/duplication testing only), GREM1 (promoter region deletion/duplication testing only), KIT, MEN1, MLH1, MSH2, MSH3, MSH6, MUTYH, NBN, NF1, NHTL1, PALB2, PDGFRA, PMS2, POLD1, POLE, PTEN, RAD50, RAD51C, RAD51D, RNF43, SDHB, SDHC, SDHD, SMAD4, SMARCA4. STK11, TP53, TSC1, TSC2, and VHL.  The following genes were evaluated for sequence changes only: SDHA and HOXB13 c.251G>A variant only. The test report will be scanned into EPIC and located under the Molecular Pathology section of the Results Review tab.  A portion of the result report is included below for reference.     We discussed with Ms. Tesar that because current genetic testing is not perfect, it is possible there may be a gene mutation in one of these genes that current testing cannot detect, but that chance is small.  We also discussed, that there could be another gene that has not yet been discovered, or that we have not yet tested, that is responsible for the cancer diagnoses in the family. It is also possible there is a hereditary cause for the cancer in the family that Ms. Ribble did not inherit and therefore was not identified in her  testing.  Therefore, it is important to remain in touch with cancer genetics in the future so that we can continue to offer Ms. Shook the most up to date genetic testing.   CANCER SCREENING RECOMMENDATIONS: Ms. Kube test result is considered negative (normal).  This means that we have not identified a hereditary cause for her personal and family history of cancer at this time.  While reassuring, this does not definitively rule out a hereditary predisposition to cancer. It is still possible that there could be genetic mutations that are undetectable by current technology. There could be genetic mutations in genes that have not been tested or identified to increase cancer risk.  Therefore, it is recommended she continue to follow the cancer management and screening guidelines provided by her oncology and primary healthcare provider.   An individual's cancer risk and medical management are not determined by genetic test results alone. Overall cancer risk assessment incorporates additional factors, including personal medical history, family history, and any available genetic information that may result in a personalized plan for cancer prevention and surveillance.  RECOMMENDATIONS FOR FAMILY MEMBERS:  Individuals  in this family might be at some increased risk of developing cancer, over the general population risk, simply due to the family history of cancer.  We recommended women in this family have a yearly mammogram beginning at age 70, or 32 years younger than the earliest onset of cancer, an annual clinical breast exam, and perform monthly breast self-exams. Women in this family should also have a gynecological exam as recommended by their primary provider. All family members should have a colonoscopy by age 82.  It is also possible there is a hereditary cause for the cancer in Ms. Grondahl's family that she did not inherit and therefore was not identified in her.  Based on Ms. Turbyfill's family history, we  recommended her sister, who was diagnosed with breast at age 25 and 38, have genetic counseling and testing. Ms. Bellizzi will let us know if we can be of any assistance in coordinating genetic counseling and/or testing for this family member.   FOLLOW-UP: Lastly, we discussed with Ms. Burling that cancer genetics is a rapidly advancing field and it is possible that new genetic tests will be appropriate for her and/or her family members in the future. We encouraged her to remain in contact with cancer genetics on an annual basis so we can update her personal and family histories and let her know of advances in cancer genetics that may benefit this family.   Our contact number was provided. Ms. Reznick questions were answered to her satisfaction, and she knows she is welcome to call us at anytime with additional questions or concerns.   Clint Guy, MS, [facility] Certified Genetic Counselor [email or web address] Phone: [phone]

## 2019-09-06 ENCOUNTER — Encounter (HOSPITAL_BASED_OUTPATIENT_CLINIC_OR_DEPARTMENT_OTHER): Payer: Self-pay

## 2019-09-13 ENCOUNTER — Other Ambulatory Visit (HOSPITAL_COMMUNITY): Payer: Medicare Other

## 2019-09-14 ENCOUNTER — Other Ambulatory Visit (HOSPITAL_COMMUNITY)
Admission: RE | Admit: 2019-09-14 | Discharge: 2019-09-14 | Disposition: A | Discharge status: Home / Self Care | Payer: Medicare Other | Source: Ambulatory Visit | Attending: Surgery | Admitting: Surgery

## 2019-09-14 DIAGNOSIS — Z01812 Encounter for preprocedural laboratory examination: Secondary | ICD-10-CM | POA: Diagnosis present

## 2019-09-14 DIAGNOSIS — Z20828 Contact with and (suspected) exposure to other viral communicable diseases: Secondary | ICD-10-CM | POA: Diagnosis not present

## 2019-09-15 LAB — NOVEL CORONAVIRUS, NAA (HOSP ORDER, SEND-OUT TO REF LAB; TAT 18-24 HRS): SARS-CoV-2, NAA: NOT DETECTED

## 2019-09-16 ENCOUNTER — Inpatient Hospital Stay: Admission: RE | Admit: 2019-09-16 | Payer: Medicare Other | Source: Ambulatory Visit

## 2019-09-17 ENCOUNTER — Other Ambulatory Visit: Payer: Self-pay

## 2019-09-17 ENCOUNTER — Ambulatory Visit (HOSPITAL_COMMUNITY): Payer: Medicare Other

## 2019-09-17 ENCOUNTER — Ambulatory Visit
Admission: RE | Admit: 2019-09-17 | Discharge: 2019-09-17 | Disposition: A | Discharge status: Home / Self Care | Payer: Medicare Other | Source: Ambulatory Visit | Attending: Surgery | Admitting: Surgery

## 2019-09-17 ENCOUNTER — Encounter (HOSPITAL_BASED_OUTPATIENT_CLINIC_OR_DEPARTMENT_OTHER)
Admission: RE | Admit: 2019-09-17 | Discharge: 2019-09-17 | Disposition: A | Discharge status: Home / Self Care | Payer: Medicare Other | Source: Ambulatory Visit | Attending: Surgery | Admitting: Surgery

## 2019-09-17 DIAGNOSIS — Z170 Estrogen receptor positive status [ER+]: Secondary | ICD-10-CM

## 2019-09-17 DIAGNOSIS — Z01810 Encounter for preprocedural cardiovascular examination: Secondary | ICD-10-CM | POA: Diagnosis not present

## 2019-09-17 NOTE — H&P (Signed)
Carley Hammed Documented: 08/28/2019 7:51 AM  Patient #: [id number] DOB: 1951-07-28 Undefined / Language: Cleophus Molt / Race: White Female   History of Present Illness   The patient is a 68 year old female who presents with a complaint of breast cancer.  The PCP is Marrianne Mood, NP  The patient was referred by Dr. Theodosia Blender  The pateint is at the Breast [facility] - Oncology is Drs. [place] and [place]  She is accompanied by her husband, Clint Lipps.  [The Covid-19 virus has disrupted normal medical care in [place] and across the nation. We have sometimes had to alter normal surgical/medical care to limit this epidemic and we have explained these changes to the patient.]  She gets annual mammograms.   Mammograms: [facility], mammograms on 08/01/2019 - showed 0.8 x 0.5 cm mass at the 8:45 o'clock position of the right breast Biopsy: Right breast biopsy at 8:45 o'clock on 08/21/2019 ([id number]) IDC, grade 1, ER - 90%, PR - 85%, Ki67 - 5% Family history of breast or ovarian cancer: Her father had breast cancer (he got genetic testing) - he died in 12/07/2009. Her sister has had 2 breast cancers. On hormone therapy: She took some estrogen cream for a couple of weeks in September.  I discussed the options for breast cancer treatment with the patient. The patient is at the [facility], which includes medical oncology and radiation oncology. I discussed the surgical options of lumpectomy vs. mastectomy. If mastectomy, there is the possibility of reconstruction. I discussed the options of lymph node biopsy. The treatment plan depends on the pathologic staging of the tumor and the patient's personal wishes. The risks of surgery include, but are not limited to, bleeding, infection, the need for further surgery, and nerve injury. The patient has been given literature on the treatment of breast cancer.  Plan: 1. Right breast lumpectomy (seed  localization) and right axillary sentinel lymph node biopsy, 2. Rad tx, 3. antihormone, 4. genetics (she does not want to wait)  Past Medical History: 1. Right breast cancer Right breast biopsy at 8:45 o'clock on 08/21/2019 ([id number]) IDC, grade 1, ER - 90%, PR - 85%, Ki67 - 5% 2. She had thyroid radiation in 12/08/07 and is on thyroid replacement 3. HTN x 20 years 4. Colonoscopy - 07-Dec-2013 5. severe UTI in Sept 2020 - resolved now  Social History: Married. [place]. She has 2 children: Amanda Cockayne, 1 yo, in [place], [place], [place] 66 yo in [place], [place] She retired in Dec 2019 from [employer] offices  Medication History Tawni Pummel, RN; 08/28/2019 7:51 AM) Medications Reconciled  Physical Exam  General: WN WF who is alert and generally healthy appearing. She is wearing a mask. Skin: Inspection and palpation of the skin unremarkable.  Eyes: Conjunctivae white, pupils equal. Face, ears, nose, mouth, and throat: Face - She is wearing a mask.  Neck: Supple. No mass. Trachea midline. No thyroid mass.  Lymph Nodes: No supraclavicular or cervical adenopathy. No axillary adenopathy.  Lungs: Normal respiratory effort. Clear to auscultation and symmetric breath sounds. Cardiovascular: Regular rate and rhythm. Normal auscultation of the heart. No murmur or rub.  Breast: Right - bruise at 9 o'clock. I cannot feel a mass. Left - no mass or nodule.  Abdomen: Soft. No mass. Liver and spleen not palpable. No tenderness. No hernia. Normal bowel sounds.  Rectal: Not done.  Musculoskeletal/extremities: Normal gait. Good strength and ROM in upper and lower extremities.   Neurologic: Grossly intact  to motor and sensory function.   Psychiatric: Has normal mood and affect. Judgement and insight appear normal.  Assessment & Plan  1.  MALIGNANT NEOPLASM OF RIGHT BREAST, STAGE 1, ESTROGEN RECEPTOR POSITIVE  (C50.911)  Story: Right breast biopsy at 8:45 o'clock on 08/21/2019 ([id number]) IDC, grade 1, ER - 90%, PR - 85%, Ki67 - 5%  Oncology - Gudena and Kinard  Plan:  1. Right breast lumpectomy (seed localization) and right axillary sentinel lymph node biopsy,   2. Rad tx,  3. antihormone,  4. genetics (she does not want to wait)   Addendum Note(Lucille Crichlow H. [name] MD; 09/04/2019 3:35 PM)   genetics negative - from Clint Guy  2. She had thyroid radiation in 2009 and is on thyroid replacement 3. HTN x 20 years  Alphonsa Overall, MD, [facility] Office phone:  [phone]

## 2019-09-17 NOTE — Progress Notes (Signed)

## 2019-09-18 ENCOUNTER — Encounter (HOSPITAL_COMMUNITY)
Admission: RE | Admit: 2019-09-18 | Discharge: 2019-09-18 | Disposition: A | Discharge status: Home / Self Care | Payer: Medicare Other | Source: Ambulatory Visit | Attending: Surgery | Admitting: Surgery

## 2019-09-18 ENCOUNTER — Ambulatory Visit
Admission: RE | Admit: 2019-09-18 | Discharge: 2019-09-18 | Disposition: A | Discharge status: Home / Self Care | Payer: Medicare Other | Source: Ambulatory Visit | Attending: Surgery | Admitting: Surgery

## 2019-09-18 ENCOUNTER — Encounter (HOSPITAL_BASED_OUTPATIENT_CLINIC_OR_DEPARTMENT_OTHER)
Admission: RE | Disposition: A | Discharge status: Home / Self Care | Payer: Self-pay | Source: Home / Self Care | Attending: Surgery

## 2019-09-18 ENCOUNTER — Ambulatory Visit (HOSPITAL_BASED_OUTPATIENT_CLINIC_OR_DEPARTMENT_OTHER): Payer: Medicare Other | Admitting: Anesthesiology

## 2019-09-18 ENCOUNTER — Other Ambulatory Visit: Payer: Self-pay

## 2019-09-18 ENCOUNTER — Encounter (HOSPITAL_BASED_OUTPATIENT_CLINIC_OR_DEPARTMENT_OTHER): Payer: Self-pay

## 2019-09-18 ENCOUNTER — Ambulatory Visit (HOSPITAL_COMMUNITY)
Admission: RE | Admit: 2019-09-18 | Discharge: 2019-09-18 | Disposition: A | Discharge status: Home / Self Care | Payer: Medicare Other | Attending: Surgery | Admitting: Surgery

## 2019-09-18 DIAGNOSIS — Z79890 Hormone replacement therapy (postmenopausal): Secondary | ICD-10-CM | POA: Insufficient documentation

## 2019-09-18 DIAGNOSIS — C50911 Malignant neoplasm of unspecified site of right female breast: Secondary | ICD-10-CM

## 2019-09-18 DIAGNOSIS — I10 Essential (primary) hypertension: Secondary | ICD-10-CM | POA: Insufficient documentation

## 2019-09-18 DIAGNOSIS — C50511 Malignant neoplasm of lower-outer quadrant of right female breast: Secondary | ICD-10-CM | POA: Diagnosis present

## 2019-09-18 DIAGNOSIS — Z803 Family history of malignant neoplasm of breast: Secondary | ICD-10-CM | POA: Diagnosis not present

## 2019-09-18 DIAGNOSIS — Z170 Estrogen receptor positive status [ER+]: Secondary | ICD-10-CM

## 2019-09-18 DIAGNOSIS — Z923 Personal history of irradiation: Secondary | ICD-10-CM | POA: Insufficient documentation

## 2019-09-18 HISTORY — PX: BREAST LUMPECTOMY WITH RADIOACTIVE SEED AND SENTINEL LYMPH NODE BIOPSY: SHX6550

## 2019-09-18 SURGERY — BREAST LUMPECTOMY WITH RADIOACTIVE SEED AND SENTINEL LYMPH NODE BIOPSY
Anesthesia: General | Site: Breast | Laterality: Right

## 2019-09-18 MED ORDER — BUPIVACAINE HCL (PF) 0.25 % IJ SOLN
INTRAMUSCULAR | Status: AC
  Filled 2019-09-18: qty 60

## 2019-09-18 MED ORDER — ACETAMINOPHEN 500 MG PO TABS
ORAL_TABLET | ORAL | Status: AC
  Filled 2019-09-18: qty 2

## 2019-09-18 MED ORDER — CEFAZOLIN SODIUM-DEXTROSE 2-4 GM/100ML-% IV SOLN
2.0000 g | INTRAVENOUS | Status: AC
  Administered 2019-09-18: 2 g via INTRAVENOUS

## 2019-09-18 MED ORDER — LIDOCAINE HCL (CARDIAC) PF 100 MG/5ML IV SOSY
PREFILLED_SYRINGE | INTRAVENOUS | Status: DC | PRN
  Administered 2019-09-18: 50 mg via INTRAVENOUS

## 2019-09-18 MED ORDER — DIPHENHYDRAMINE HCL 50 MG/ML IJ SOLN
INTRAMUSCULAR | Status: DC | PRN
  Administered 2019-09-18: 6.25 mg via INTRAVENOUS

## 2019-09-18 MED ORDER — EPHEDRINE 5 MG/ML INJ
INTRAVENOUS | Status: AC
  Filled 2019-09-18: qty 10

## 2019-09-18 MED ORDER — DIPHENHYDRAMINE HCL 50 MG/ML IJ SOLN
INTRAMUSCULAR | Status: AC
  Filled 2019-09-18: qty 1

## 2019-09-18 MED ORDER — PHENYLEPHRINE 40 MCG/ML (10ML) SYRINGE FOR IV PUSH (FOR BLOOD PRESSURE SUPPORT)
PREFILLED_SYRINGE | INTRAVENOUS | Status: AC
  Filled 2019-09-18: qty 10

## 2019-09-18 MED ORDER — MIDAZOLAM HCL 2 MG/2ML IJ SOLN
0.5000 mg | INTRAMUSCULAR | Status: DC | PRN
  Administered 2019-09-18 (×2): 1 mg via INTRAVENOUS

## 2019-09-18 MED ORDER — FENTANYL CITRATE (PF) 100 MCG/2ML IJ SOLN: 25.0000 ug | INTRAMUSCULAR | Status: DC | PRN

## 2019-09-18 MED ORDER — SUCCINYLCHOLINE CHLORIDE 200 MG/10ML IV SOSY
PREFILLED_SYRINGE | INTRAVENOUS | Status: AC
  Filled 2019-09-18: qty 10

## 2019-09-18 MED ORDER — OXYCODONE HCL 5 MG/5ML PO SOLN: 5.0000 mg | Freq: Once | ORAL | Status: AC | PRN

## 2019-09-18 MED ORDER — ONDANSETRON HCL 4 MG/2ML IJ SOLN
INTRAMUSCULAR | Status: DC | PRN
  Administered 2019-09-18: 4 mg via INTRAVENOUS

## 2019-09-18 MED ORDER — SODIUM CHLORIDE (PF) 0.9 % IJ SOLN
INTRAMUSCULAR | Status: AC
  Filled 2019-09-18: qty 10

## 2019-09-18 MED ORDER — CEFAZOLIN SODIUM-DEXTROSE 2-4 GM/100ML-% IV SOLN
INTRAVENOUS | Status: AC
  Filled 2019-09-18: qty 100

## 2019-09-18 MED ORDER — TRAMADOL HCL 50 MG PO TABS: 50.0000 mg | ORAL_TABLET | Freq: Four times a day (QID) | ORAL | 1 refills | Status: DC | PRN

## 2019-09-18 MED ORDER — PROPOFOL 10 MG/ML IV BOLUS
INTRAVENOUS | Status: DC | PRN
  Administered 2019-09-18: 150 mg via INTRAVENOUS

## 2019-09-18 MED ORDER — OXYCODONE HCL 5 MG PO TABS
5.0000 mg | ORAL_TABLET | Freq: Once | ORAL | Status: AC | PRN
  Administered 2019-09-18: 5 mg via ORAL

## 2019-09-18 MED ORDER — DEXAMETHASONE SODIUM PHOSPHATE 4 MG/ML IJ SOLN
INTRAMUSCULAR | Status: DC | PRN
  Administered 2019-09-18: 8 mg via INTRAVENOUS

## 2019-09-18 MED ORDER — ONDANSETRON HCL 4 MG/2ML IJ SOLN
4.0000 mg | Freq: Four times a day (QID) | INTRAMUSCULAR | Status: AC | PRN
  Administered 2019-09-18: 4 mg via INTRAVENOUS

## 2019-09-18 MED ORDER — BUPIVACAINE HCL (PF) 0.5 % IJ SOLN
INTRAMUSCULAR | Status: AC
  Filled 2019-09-18: qty 30

## 2019-09-18 MED ORDER — MIDAZOLAM HCL 2 MG/2ML IJ SOLN
INTRAMUSCULAR | Status: AC
  Filled 2019-09-18: qty 2

## 2019-09-18 MED ORDER — FENTANYL CITRATE (PF) 100 MCG/2ML IJ SOLN
INTRAMUSCULAR | Status: AC
  Filled 2019-09-18: qty 2

## 2019-09-18 MED ORDER — FENTANYL CITRATE (PF) 100 MCG/2ML IJ SOLN
50.0000 ug | INTRAMUSCULAR | Status: DC | PRN
  Administered 2019-09-18 (×2): 50 ug via INTRAVENOUS

## 2019-09-18 MED ORDER — METHYLENE BLUE 0.5 % INJ SOLN
INTRAVENOUS | Status: AC
  Filled 2019-09-18: qty 10

## 2019-09-18 MED ORDER — ACETAMINOPHEN 500 MG PO TABS
1000.0000 mg | ORAL_TABLET | ORAL | Status: AC
  Administered 2019-09-18: 1000 mg via ORAL

## 2019-09-18 MED ORDER — OXYCODONE HCL 5 MG PO TABS
ORAL_TABLET | ORAL | Status: AC
  Filled 2019-09-18: qty 1

## 2019-09-18 MED ORDER — SCOPOLAMINE 1 MG/3DAYS TD PT72
MEDICATED_PATCH | TRANSDERMAL | Status: AC
  Filled 2019-09-18: qty 1

## 2019-09-18 MED ORDER — LACTATED RINGERS IV SOLN
INTRAVENOUS | Status: DC
  Administered 2019-09-18 (×2): via INTRAVENOUS

## 2019-09-18 MED ORDER — ONDANSETRON HCL 4 MG/2ML IJ SOLN
INTRAMUSCULAR | Status: AC
  Filled 2019-09-18: qty 2

## 2019-09-18 MED ORDER — EPHEDRINE SULFATE 50 MG/ML IJ SOLN
INTRAMUSCULAR | Status: DC | PRN
  Administered 2019-09-18: 10 mg via INTRAVENOUS

## 2019-09-18 MED ORDER — CHLORHEXIDINE GLUCONATE CLOTH 2 % EX PADS: 6.0000 | MEDICATED_PAD | Freq: Once | CUTANEOUS | Status: DC

## 2019-09-18 MED ORDER — SCOPOLAMINE 1 MG/3DAYS TD PT72
1.0000 | MEDICATED_PATCH | TRANSDERMAL | Status: DC
  Administered 2019-09-18: 1 via TRANSDERMAL

## 2019-09-18 MED ORDER — DEXAMETHASONE SODIUM PHOSPHATE 10 MG/ML IJ SOLN
INTRAMUSCULAR | Status: AC
  Filled 2019-09-18: qty 1

## 2019-09-18 MED ORDER — LIDOCAINE 2% (20 MG/ML) 5 ML SYRINGE
INTRAMUSCULAR | Status: AC
  Filled 2019-09-18: qty 5

## 2019-09-18 MED ORDER — TECHNETIUM TC 99M SULFUR COLLOID FILTERED
1.0000 | Freq: Once | INTRAVENOUS | Status: AC | PRN
  Administered 2019-09-18: 1 via INTRADERMAL

## 2019-09-18 MED ORDER — BUPIVACAINE-EPINEPHRINE (PF) 0.5% -1:200000 IJ SOLN
INTRAMUSCULAR | Status: DC | PRN
  Administered 2019-09-18: 25 mL via PERINEURAL

## 2019-09-18 MED ORDER — BUPIVACAINE HCL (PF) 0.25 % IJ SOLN
INTRAMUSCULAR | Status: DC | PRN
  Administered 2019-09-18: 30 mL

## 2019-09-18 SURGICAL SUPPLY — 53 items
BENZOIN TINCTURE PRP APPL 2/3 (GAUZE/BANDAGES/DRESSINGS) IMPLANT
BINDER BREAST LRG (GAUZE/BANDAGES/DRESSINGS) ×2 IMPLANT
BINDER BREAST MEDIUM (GAUZE/BANDAGES/DRESSINGS) IMPLANT
BINDER BREAST XLRG (GAUZE/BANDAGES/DRESSINGS) IMPLANT
BINDER BREAST XXLRG (GAUZE/BANDAGES/DRESSINGS) IMPLANT
BLADE SURG 15 STRL LF DISP TIS (BLADE) ×1 IMPLANT
BLADE SURG 15 STRL SS (BLADE) ×2
CANISTER SUC SOCK COL 7IN (MISCELLANEOUS) IMPLANT
CANISTER SUCT 1200ML W/VALVE (MISCELLANEOUS) ×3 IMPLANT
CHLORAPREP W/TINT 26 (MISCELLANEOUS) ×3 IMPLANT
CLIP VESOCCLUDE SM WIDE 6/CT (CLIP) ×3 IMPLANT
CLOSURE WOUND 1/2 X4 (GAUZE/BANDAGES/DRESSINGS)
COVER BACK TABLE REUSABLE LG (DRAPES) ×3 IMPLANT
COVER MAYO STAND REUSABLE (DRAPES) ×3 IMPLANT
COVER PROBE W GEL 5X96 (DRAPES) ×3 IMPLANT
COVER WAND RF STERILE (DRAPES) IMPLANT
DECANTER SPIKE VIAL GLASS SM (MISCELLANEOUS) IMPLANT
DERMABOND ADVANCED (GAUZE/BANDAGES/DRESSINGS) ×2
DERMABOND ADVANCED .7 DNX12 (GAUZE/BANDAGES/DRESSINGS) ×1 IMPLANT
DRAPE HALF SHEET 70X43 (DRAPES) ×3 IMPLANT
DRAPE LAPAROSCOPIC ABDOMINAL (DRAPES) ×3 IMPLANT
DRAPE UTILITY XL STRL (DRAPES) ×3 IMPLANT
DRSG PAD ABDOMINAL 8X10 ST (GAUZE/BANDAGES/DRESSINGS) IMPLANT
ELECT COATED BLADE 2.86 ST (ELECTRODE) ×3 IMPLANT
ELECT REM PT RETURN 9FT ADLT (ELECTROSURGICAL) ×3
ELECTRODE REM PT RTRN 9FT ADLT (ELECTROSURGICAL) ×1 IMPLANT
GAUZE SPONGE 4X4 12PLY STRL (GAUZE/BANDAGES/DRESSINGS) ×3 IMPLANT
GLOVE SURG SYN 7.5  E (GLOVE) ×4
GLOVE SURG SYN 7.5 E (GLOVE) ×2 IMPLANT
GLOVE SURG SYN 7.5 PF PI (GLOVE) ×2 IMPLANT
GOWN STRL REUS W/ TWL LRG LVL3 (GOWN DISPOSABLE) ×1 IMPLANT
GOWN STRL REUS W/ TWL XL LVL3 (GOWN DISPOSABLE) ×1 IMPLANT
GOWN STRL REUS W/TWL LRG LVL3 (GOWN DISPOSABLE) ×2
GOWN STRL REUS W/TWL XL LVL3 (GOWN DISPOSABLE) ×2
KIT MARKER MARGIN INK (KITS) ×3 IMPLANT
NDL HYPO 25X1 1.5 SAFETY (NEEDLE) ×1 IMPLANT
NDL SAFETY ECLIPSE 18X1.5 (NEEDLE) IMPLANT
NEEDLE HYPO 18GX1.5 SHARP (NEEDLE)
NEEDLE HYPO 25X1 1.5 SAFETY (NEEDLE) ×3 IMPLANT
NS IRRIG 1000ML POUR BTL (IV SOLUTION) ×3 IMPLANT
PACK BASIN DAY SURGERY FS (CUSTOM PROCEDURE TRAY) ×3 IMPLANT
PENCIL SMOKE EVACUATOR (MISCELLANEOUS) ×3 IMPLANT
SLEEVE SCD COMPRESS KNEE MED (MISCELLANEOUS) ×3 IMPLANT
SPONGE LAP 18X18 RF (DISPOSABLE) ×3 IMPLANT
STRIP CLOSURE SKIN 1/2X4 (GAUZE/BANDAGES/DRESSINGS) IMPLANT
SUT MNCRL AB 4-0 PS2 18 (SUTURE) ×3 IMPLANT
SUT VICRYL 3-0 CR8 SH (SUTURE) ×3 IMPLANT
SYR CONTROL 10ML LL (SYRINGE) ×3 IMPLANT
TOWEL GREEN STERILE FF (TOWEL DISPOSABLE) ×3 IMPLANT
TRAY FAXITRON CT DISP (TRAY / TRAY PROCEDURE) ×3 IMPLANT
TUBE CONNECTING 20'X1/4 (TUBING) ×1
TUBE CONNECTING 20X1/4 (TUBING) ×2 IMPLANT
YANKAUER SUCT BULB TIP NO VENT (SUCTIONS) ×3 IMPLANT

## 2019-09-18 NOTE — Anesthesia Procedure Notes (Signed)
Anesthesia Regional Block: Pectoralis block   Pre-Anesthetic Checklist: ,, timeout performed, Correct Patient, Correct Site, Correct Laterality, Correct Procedure, Correct Position, site marked, Risks and benefits discussed,  Surgical consent,  Pre-op evaluation,  At surgeon's request and post-op pain management  Laterality: Right  Prep: chloraprep       Needles:  Injection technique: Single-shot  Needle Type: Echogenic Needle     Needle Length: 9cm  Needle Gauge: 21     Additional Needles:   Narrative:  Start time: 09/18/2019 7:44 AM End time: 09/18/2019 7:52 AM Injection made incrementally with aspirations every 5 mL.  Performed by: Personally  Anesthesiologist: Albertha Ghee, MD  Additional Notes: Pt tolerated the procedure well.

## 2019-09-18 NOTE — Anesthesia Preprocedure Evaluation (Signed)
Anesthesia Evaluation  Patient identified by MRN, date of birth, ID band Patient awake    Reviewed: Allergy & Precautions, H&P , NPO status , Patient's Chart, lab work & pertinent test results  Airway Mallampati: II   Neck ROM: full    Dental   Pulmonary neg pulmonary ROS,    breath sounds clear to auscultation       Cardiovascular hypertension,  Rhythm:regular Rate:Normal     Neuro/Psych    GI/Hepatic   Endo/Other    Renal/GU      Musculoskeletal   Abdominal   Peds  Hematology   Anesthesia Other Findings   Reproductive/Obstetrics Breast CA                             Anesthesia Physical Anesthesia Plan  ASA: II  Anesthesia Plan: General   Post-op Pain Management:  Regional for Post-op pain   Induction: Intravenous  PONV Risk Score and Plan: 3 and Ondansetron, Dexamethasone, Treatment may vary due to age or medical condition and Midazolam  Airway Management Planned: LMA  Additional Equipment:   Intra-op Plan:   Post-operative Plan:   Informed Consent: I have reviewed the patients History and Physical, chart, labs and discussed the procedure including the risks, benefits and alternatives for the proposed anesthesia with the patient or authorized representative who has indicated his/her understanding and acceptance.       Plan Discussed with: CRNA, Anesthesiologist and Surgeon  Anesthesia Plan Comments:         Anesthesia Quick Evaluation

## 2019-09-18 NOTE — Discharge Instructions (Signed)
No Tylenol before 1:10pm today  [facility] - DISCHARGE INSTRUCTIONS TO PATIENT  Activity:  Driving - May drive in 2 or 3 days, if doing well and off pain meds   Lifting - No lifting more than 15 pounds for 1 week, then no limit                       Practice your Covid-19 protection:  Wear a mask, social distance, and wash your hands frequently  Wound Care:   Leave the incision dry for 2 days, then you may shower  Diet:  As tolerated  Follow up appointment:  Call Dr. Pollie Friar office [facility]) at [phone] for an appointment in 2 to 4 weeks.  Medications and dosages:  Resume your home medications.  You have a prescription for:  Tramadol  Call Dr. [name] or his office  [phone]) if you have:  Temperature greater than 100.4,  Severe uncontrolled pain,  Redness, tenderness, or signs of infection (pain, swelling, redness, odor or green/yellow discharge around the site),  Any other questions or concerns you may have after discharge.  In an emergency, call 911 or go to an Emergency Department at a nearby hospital.   Post Anesthesia Home Care Instructions  Activity: Get plenty of rest for the remainder of the day. A responsible individual must stay with you for 24 hours following the procedure.  For the next 24 hours, DO NOT: -Drive a car -Paediatric nurse -Drink alcoholic beverages -Take any medication unless instructed by your physician -Make any legal decisions or sign important papers.  Meals: Start with liquid foods such as gelatin or soup. Progress to regular foods as tolerated. Avoid greasy, spicy, heavy foods. If nausea and/or vomiting occur, drink only clear liquids until the nausea and/or vomiting subsides. Call your physician if vomiting continues.  Special Instructions/Symptoms: Your throat may feel dry or sore from the anesthesia or the breathing tube placed in your throat during surgery. If this causes discomfort, gargle with  warm salt water. The discomfort should disappear within 24 hours.  If you had a scopolamine patch placed behind your ear for the management of post- operative nausea and/or vomiting:  1. The medication in the patch is effective for 72 hours, after which it should be removed.  Wrap patch in a tissue and discard in the trash. Wash hands thoroughly with soap and water. 2. You may remove the patch earlier than 72 hours if you experience unpleasant side effects which may include dry mouth, dizziness or visual disturbances. 3. Avoid touching the patch. Wash your hands with soap and water after contact with the patch.     Regional Anesthesia Blocks  1. Numbness or the inability to move the "blocked" extremity may last from 3-48 hours after placement. The length of time depends on the medication injected and your individual response to the medication. If the numbness is not going away after 48 hours, call your surgeon.  2. The extremity that is blocked will need to be protected until the numbness is gone and the  Strength has returned. Because you cannot feel it, you will need to take extra care to avoid injury. Because it may be weak, you may have difficulty moving it or using it. You may not know what position it is in without looking at it while the block is in effect.  3. For blocks in the legs and feet, returning to weight bearing and walking needs to be done  carefully. You will need to wait until the numbness is entirely gone and the strength has returned. You should be able to move your leg and foot normally before you try and bear weight or walk. You will need someone to be with you when you first try to ensure you do not fall and possibly risk injury.  4. Bruising and tenderness at the needle site are common side effects and will resolve in a few days.  5. Persistent numbness or new problems with movement should be communicated to the surgeon or the [facility] [phone]  [place] [phone]).

## 2019-09-18 NOTE — Op Note (Addendum)
09/18/2019  9:58 AM  PATIENT:  Jasmine Sosa DOB: 11-28-1950 MRN: [id number]  PREOP DIAGNOSIS:   RIGHT BREAST CANCER  POSTOP DIAGNOSIS:    Right breast cancer, 8 o'clock position (T1, N0)  PROCEDURE:   Procedure(s): RIGHT BREAST LUMPECTOMY WITH RADIOACTIVE SEED AND RIGHT AXILLARY SENTINEL LYMPH NODE BIOPSY, deep sentinel lymph node biopsy  SURGEON:   Alphonsa Overall, M.D.  ANESTHESIA:   General  Anesthesiologist: Albertha Ghee, MD CRNA: Willa Frater, CRNA  General  EBL:  minimal  ml  DRAINS:  none   LOCAL MEDICATIONS USED:   30 cc 1/4% marcaine, right pectoral block by anesthesia  SPECIMEN:   Right breast lumpectomy (6 color paint), superior margin (suture anterior and painted), medial margin (suture anterior and painted), right axillary sentinel node (counts - 190, background - 5)  COUNTS CORRECT:  YES  INDICATIONS FOR PROCEDURE:  Jasmine Sosa is a 68 y.o. (DOB: January 31, 1951) white female whose primary care physician is Carlis Abbott, Felipa Evener, NP and comes for right breast lumpectomy and right axillary sentinel lymph node biopsy.   Jasmine Sosa was seen at [facility] with Drs. Gudena and Kinard for a right breast cancer.  The options for breast cancer treatment have been discussed with the patient. She elected to proceed with lumpectomy and axillary sentinel lymph node.     The indications and potential complications of surgery were explained to the patient. Potential complications include, but are not limited to, bleeding, infection, the need for further surgery, and nerve injury.     She had a I131 seed placed on 09/17/2019 in her right breast at [facility].  The seed is in the 8 o'clock position of the right breast.   In the holding area, her right areola was injected with 1 millicurie of Technitium Sulfur Colloid.  OPERATIVE NOTE:   The patient was taken to operating room # 2 at [facility] where she underwent a general anesthesia   supervised by Anesthesiologist: Albertha Ghee, MD CRNA: Willa Frater, CRNA. Her right breast and axilla were prepped with  ChloraPrep and sterilely draped.    A time-out was held and the surgical check list was reviewed.    The cancer was about at the 8 o'clock position of the right breast.   It was 4 cm from the areola.  I made an incision directly over the tumor.    I used the Neoprobe to identify the I131 seed.  I tried to excise an area around the tumor of at least 1 cm.    I excised this block of breast tissue approximately 3 cm by 4 cm  in diameter.   I painted the lumpectomy specimen with the 6 color paint kit and did a specimen mammogram which confirmed the mass, clip, and the seed were all in the right position in the specimen.  The specimen was sent to pathology who called back to confirm that they have the seed and the specimen.   The tumor appeared close to the superior and medial margins.  So I took an additional superior margin and an additional medial margin.  I marked each specimen with a suture anteriorly and painted the margin with 2 colors.   I then started the right deep axillary sentinel lymph node biopsy. I made an incision in the right axilla.  I found a hot area at the junction of the breast and the pectoralis major muscle, deep in the axilla. I cut down and  identified a hot node that had counts of 190 and the background has 5 counts.  I checked her internal mammary nodes and supraclavicular nodes with the neoprobe and found no other hot area. The axillary node was then sent to pathology.    I then irrigated the wound with saline. I infiltrated approximately 30 mL of 1/4% Marcaine between the incisions. I placed 4 clips to mark biopsy cavity, at 12, 3, 6, and 9 o'clock.  I then closed all the wounds in layers using 3-0 Vicryl sutures for the deep layer. At the skin, I closed the incisions with a 4-0 Monocryl suture. The incisions were then painted with Dermabond.  She had gauze  place over the wounds and placed in a breast binder.   The patient tolerated the procedure well, was transported to the recovery room in good condition. Sponge and needle count were correct at the end of the case.   Final pathology is pending.    Right breast lumpectomy (seed and marker in the UOQ of specimen)   Alphonsa Overall, MD, [facility] Pager: [phone] Office phone:  [phone]

## 2019-09-18 NOTE — Progress Notes (Signed)
  Assisted Dr. Hodierne with right, ultrasound guided, pectoralis block. Side rails up, monitors on throughout procedure. See vital signs in flow sheet. Tolerated Procedure well. 

## 2019-09-18 NOTE — Interval H&P Note (Signed)
History and Physical Interval Note:  09/18/2019 8:12 AM  Jasmine Sosa  has presented today for surgery, with the diagnosis of RIGHT BREAST CANCER.  The various methods of treatment have been discussed with the patient and family.   Her husband is with her.  The seed is in good location.  After consideration of risks, benefits and other options for treatment, the patient has consented to  Procedure(s): RIGHT BREAST LUMPECTOMY WITH RADIOACTIVE SEED AND RIGHT AXILLARY SENTINEL LYMPH NODE BIOPSY (Right) as a surgical intervention.  The patient's history has been reviewed, patient examined, no change in status, stable for surgery.  I have reviewed the patient's chart and labs.  Questions were answered to the patient's satisfaction.     [name]

## 2019-09-18 NOTE — Anesthesia Procedure Notes (Signed)
Procedure Name: LMA Insertion Date/Time: 09/18/2019 8:32 AM Performed by: Willa Frater, CRNA Pre-anesthesia Checklist: Patient identified, Emergency Drugs available, Suction available and Patient being monitored Patient Re-evaluated:Patient Re-evaluated prior to induction Oxygen Delivery Method: Circle system utilized Preoxygenation: Pre-oxygenation with 100% oxygen Induction Type: IV induction Ventilation: Mask ventilation without difficulty LMA: LMA inserted LMA Size: 3.0 Number of attempts: 1 Airway Equipment and Method: Bite block Placement Confirmation: positive ETCO2 Tube secured with: Tape Dental Injury: Teeth and Oropharynx as per pre-operative assessment

## 2019-09-18 NOTE — Transfer of Care (Signed)
Immediate Anesthesia Transfer of Care Note  Patient: Jasmine Sosa  Procedure(s) Performed: RIGHT BREAST LUMPECTOMY WITH RADIOACTIVE SEED AND RIGHT AXILLARY SENTINEL LYMPH NODE BIOPSY (Right Breast)  Patient Location: PACU  Anesthesia Type:GA combined with regional for post-op pain  Level of Consciousness: sedated  Airway & Oxygen Therapy: Patient Spontanous Breathing and Patient connected to face mask oxygen  Post-op Assessment: Report given to RN and Post -op Vital signs reviewed and stable  Post vital signs: stable  Last Vitals:  Vitals Value Taken Time  BP    Temp    Pulse 74 09/18/19 1009  Resp 20 09/18/19 1009  SpO2 100 % 09/18/19 1009  Vitals shown include unvalidated device data.  Last Pain:  Vitals:   09/18/19 0700  TempSrc: Tympanic  PainSc: 0-No pain      Patients Stated Pain Goal: 8 ([phone])  Complications: No apparent anesthesia complications

## 2019-09-19 ENCOUNTER — Encounter (HOSPITAL_BASED_OUTPATIENT_CLINIC_OR_DEPARTMENT_OTHER): Payer: Self-pay | Admitting: Surgery

## 2019-09-20 LAB — SURGICAL PATHOLOGY

## 2019-09-20 NOTE — Anesthesia Postprocedure Evaluation (Signed)
Anesthesia Post Note  Patient: Jasmine Sosa  Procedure(s) Performed: RIGHT BREAST LUMPECTOMY WITH RADIOACTIVE SEED AND RIGHT AXILLARY SENTINEL LYMPH NODE BIOPSY (Right Breast)     Patient location during evaluation: PACU Anesthesia Type: General and Regional Level of consciousness: awake and alert Pain management: pain level controlled Vital Signs Assessment: post-procedure vital signs reviewed and stable Respiratory status: spontaneous breathing, nonlabored ventilation, respiratory function stable and patient connected to nasal cannula oxygen Cardiovascular status: blood pressure returned to baseline and stable Postop Assessment: no apparent nausea or vomiting Anesthetic complications: no    Last Vitals:  Vitals:   09/18/19 1015 09/18/19 1030  BP: 139/75 (!) 156/84  Pulse: 79 80  Resp: 15 20  Temp:    SpO2: 100% 97%    Last Pain:  Vitals:   09/18/19 1030  TempSrc:   PainSc: 0-No pain                 [name]

## 2019-09-23 ENCOUNTER — Telehealth: Payer: Self-pay | Admitting: *Deleted

## 2019-09-23 NOTE — Telephone Encounter (Signed)
Received order for oncotype testing. Requisition faxed to pathology and [facility] °

## 2019-09-23 NOTE — Progress Notes (Signed)
Patient Care Team: [name], NP as PCP - General (Internal Medicine) Mauro Kaufmann, RN as Oncology Nurse Navigator Rockwell Germany, RN as Oncology Nurse Navigator Nicholas Lose, MD as Consulting Physician (Hematology and Oncology) Gery Pray, MD as Consulting Physician (Radiation Oncology) Alphonsa Overall, MD as Consulting Physician (General Surgery)  DIAGNOSIS:    ICD-10-CM   1. Malignant neoplasm of upper-outer quadrant of right breast in female, estrogen receptor positive (Jasmine Sosa)  C50.411    Z17.0     SUMMARY OF ONCOLOGIC HISTORY: Oncology History  Malignant neoplasm of upper-outer quadrant of right breast in female, estrogen receptor positive (Jasmine Sosa)  08/23/2019 Initial Diagnosis   Routine screening mammogram detected 0.8cm mass in the 8:45 position of the right breast, no abnormal right axillary lymph nodes. 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    09/03/2019 Genetic Testing   Negative genetic testing:  No pathogenic variants identified on the [facility] Breast Cancer STAT panel or the Common Hereditary Cancers panel. The report date is 09/03/2019.  The Breast Cancer STAT panel offered by [facility] includes sequencing and rearrangement analysis for the following 9 genes:  ATM, BRCA1, BRCA2, CDH1, CHEK2, PALB2, PTEN, STK11 and TP53.  The Common Hereditary Cancers Panel offered by [facility] includes sequencing and/or deletion duplication testing of the following 48 genes: APC, ATM, AXIN2, BARD1, BMPR1A, BRCA1, BRCA2, BRIP1, CDH1, CDK4, CDKN2A (p14ARF), CDKN2A (p16INK4a), CHEK2, CTNNA1, DICER1, EPCAM (Deletion/duplication testing only), GREM1 (promoter region deletion/duplication testing only), KIT, MEN1, MLH1, MSH2, MSH3, MSH6, MUTYH, NBN, NF1, NHTL1, PALB2, PDGFRA, PMS2, POLD1, POLE, PTEN, RAD50, RAD51C, RAD51D, RNF43, SDHB, SDHC, SDHD, SMAD4, SMARCA4. STK11, TP53, TSC1, TSC2, and VHL.  The following genes were evaluated  for sequence changes only: SDHA and HOXB13 c.251G>A variant only.   09/18/2019 Surgery   Right lumpectomy [name]): IDC, grade 1, 1.2cm, with low grade DCIS, clear margins, 2 right axillary lymph nodes negative.     CHIEF COMPLIANT: Follow-up s/p lumpectomy to discuss pathology report  INTERVAL HISTORY: Jasmine Sosa is a 68 y.o. with above-mentioned history of right breast cancer. Genetic testing was negative. She underwent a right lumpectomy with Dr. 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 She presents to the clinic today to discuss the pathology report and further treatment.   REVIEW OF SYSTEMS:   Constitutional: Denies fevers, chills or abnormal weight loss Eyes: Denies blurriness of vision Ears, nose, mouth, throat, and face: Denies mucositis or sore throat Respiratory: Denies cough, dyspnea or wheezes Cardiovascular: Denies palpitation, chest discomfort Gastrointestinal: Denies nausea, heartburn or change in bowel habits Skin: Denies abnormal skin rashes Lymphatics: Denies new lymphadenopathy or easy bruising Neurological: Denies numbness, tingling or new weaknesses Behavioral/Psych: Mood is stable, no new changes  Extremities: No lower extremity edema Breast: denies any pain or lumps or nodules in either breasts All other systems were reviewed with the patient and are negative.  I have reviewed the past medical history, past surgical history, social history and family history with the patient and they are unchanged from previous note.  ALLERGIES:  is allergic to ace inhibitors; penicillins; and sulfa antibiotics.  MEDICATIONS:  Current Outpatient Medications  Medication Sig Dispense Refill   Calcium Carb-Cholecalciferol (CALCIUM 600+D3 PO) Take by mouth.     levothyroxine (SYNTHROID) 75 MCG tablet Take 75 mcg by mouth daily before breakfast.  losartan (COZAAR) 100 MG tablet Take 100 mg by mouth daily.     traMADol (ULTRAM) 50 MG tablet Take 1 tablet (50 mg total) by mouth every 6 (six) hours as needed for moderate pain. 12 tablet 1   No current facility-administered medications for this visit.     PHYSICAL EXAMINATION: ECOG PERFORMANCE STATUS: 1 - Symptomatic but completely ambulatory  Vitals:   09/24/19 1429  BP: 134/73  Pulse: (!) 107  Resp: 17  Temp: 98.1 F (36.7 C)  SpO2: 97%   Filed Weights   09/24/19 1429  Weight: 145 lb 3.2 oz (65.9 kg)    GENERAL: alert, no distress and comfortable SKIN: skin color, texture, turgor are normal, no rashes or significant lesions EYES: normal, Conjunctiva are pink and non-injected, sclera clear OROPHARYNX: no exudate, no erythema and lips, buccal mucosa, and tongue normal  NECK: supple, thyroid normal size, non-tender, without nodularity LYMPH: no palpable lymphadenopathy in the cervical, axillary or inguinal LUNGS: clear to auscultation and percussion with normal breathing effort HEART: regular rate & rhythm and no murmurs and no lower extremity edema ABDOMEN: abdomen soft, non-tender and normal bowel sounds MUSCULOSKELETAL: no cyanosis of digits and no clubbing  NEURO: alert & oriented x 3 with fluent speech, no focal motor/sensory deficits EXTREMITIES: No lower extremity edema  LABORATORY DATA:  I have reviewed the data as listed CMP Latest Ref Rng & Units 08/28/2019  Glucose 70 - 99 mg/dL 91  BUN 8 - 23 mg/dL 16  Creatinine 0.44 - 1.00 mg/dL 0.79  Sodium 135 - 145 mmol/L 141  Potassium 3.5 - 5.1 mmol/L 3.8  Chloride 98 - 111 mmol/L 104  CO2 22 - 32 mmol/L 26  Calcium 8.9 - 10.3 mg/dL 9.7  Total Protein 6.5 - 8.1 g/dL 7.3  Total Bilirubin 0.3 - 1.2 mg/dL 0.4  Alkaline Phos 38 - 126 U/L 81  AST 15 - 41 U/L 16  ALT 0 - 44 U/L 13    Lab Results  Component Value Date   WBC 8.8 08/28/2019   HGB 13.3 08/28/2019   HCT 40.5 08/28/2019   MCV 91.2 08/28/2019    PLT 308 08/28/2019   NEUTROABS 4.6 08/28/2019    ASSESSMENT & PLAN:  Malignant neoplasm of upper-outer quadrant of right breast in female, estrogen receptor positive (Jasmine Sosa) 08/23/2019:Routine screening mammogram detected 0.8cm mass in the 8:45 position of the right breast, no abnormal right axillary lymph nodes. 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  T1b N0 stage Ia clinical stage  09/18/19: Right lumpectomy [name]): IDC, grade 1, 1.2cm, with low grade DCIS, clear margins, 2 right axillary lymph nodes negative.HER-2 - (1+), ER+ 90%, PR+ 85%, Ki67 5%.  T1cN0 Stage 1A  Pathology counseling: I discussed the final pathology report of the patient provided  a copy of this report. I discussed the margins as well as lymph node surgeries. We also discussed the final staging along with previously performed ER/PR and HER-2/neu testing.  Treatment Plan: 1.  Oncotype DX testing 2. Adjuvant radiation: Patient wants to receive this in [place]. 2.  Follow-up adjuvant antiestrogen therapy  RTC at end of RT or based upon Oncotype test result.     No orders of the defined types were placed in this encounter.  The patient has a good understanding of the overall plan. she agrees with it. she will call with any problems that may develop before the next visit here.  Nicholas Lose, MD 09/24/2019  Julious Oka Dorshimer, am acting as scribe for Dr. Nicholas Lose.  I have reviewed the above documentation for accuracy and completeness, and I agree with the above.

## 2019-09-24 ENCOUNTER — Other Ambulatory Visit: Payer: Self-pay

## 2019-09-24 ENCOUNTER — Inpatient Hospital Stay: Payer: Medicare Other | Attending: Hematology and Oncology | Admitting: Hematology and Oncology

## 2019-09-24 DIAGNOSIS — C50411 Malignant neoplasm of upper-outer quadrant of right female breast: Secondary | ICD-10-CM | POA: Insufficient documentation

## 2019-09-24 DIAGNOSIS — Z170 Estrogen receptor positive status [ER+]: Secondary | ICD-10-CM | POA: Insufficient documentation

## 2019-09-24 NOTE — Assessment & Plan Note (Signed)
08/23/2019:Routine screening mammogram detected 0.8cm mass in the 8:45 position of the right breast, no abnormal right axillary lymph nodes. 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  T1b N0 stage Ia clinical stage  09/18/19: Right lumpectomy [name]): IDC, grade 1, 1.2cm, with low grade DCIS, clear margins, 2 right axillary lymph nodes negative.HER-2 - (1+), ER+ 90%, PR+ 85%, Ki67 5%.  T1cN0 Stage 1A  Pathology counseling: I discussed the final pathology report of the patient provided  a copy of this report. I discussed the margins as well as lymph node surgeries. We also discussed the final staging along with previously performed ER/PR and HER-2/neu testing.  Treatment Plan: 1. Adjuvant radiation 2.  Follow-up adjuvant antiestrogen therapy  RTC at end of RT

## 2019-10-21 ENCOUNTER — Other Ambulatory Visit: Payer: Self-pay | Admitting: *Deleted

## 2019-10-21 ENCOUNTER — Telehealth: Payer: Self-pay | Admitting: *Deleted

## 2019-10-21 DIAGNOSIS — C50411 Malignant neoplasm of upper-outer quadrant of right female breast: Secondary | ICD-10-CM

## 2019-10-21 NOTE — Telephone Encounter (Signed)
Received oncotype score of 15/4%. Physician team notified. Called pt and discussed no chemo recommended at this time. Pt would like to have xrt in [place]. Referral made to Dr. Orlene Erm.

## 2019-10-22 ENCOUNTER — Telehealth: Payer: Self-pay | Admitting: *Deleted

## 2019-10-22 NOTE — Telephone Encounter (Signed)
Referral faxed to [facility] to Dr. Silvano Rusk - release [phone]

## 2019-10-23 ENCOUNTER — Encounter (HOSPITAL_COMMUNITY): Payer: Self-pay | Admitting: Hematology and Oncology

## 2019-10-24 ENCOUNTER — Encounter: Payer: Self-pay | Admitting: Physical Therapy

## 2019-10-24 ENCOUNTER — Other Ambulatory Visit: Payer: Self-pay

## 2019-10-24 ENCOUNTER — Ambulatory Visit: Payer: Medicare Other | Attending: Surgery | Admitting: Physical Therapy

## 2019-10-24 DIAGNOSIS — G8929 Other chronic pain: Secondary | ICD-10-CM | POA: Insufficient documentation

## 2019-10-24 DIAGNOSIS — Z483 Aftercare following surgery for neoplasm: Secondary | ICD-10-CM | POA: Insufficient documentation

## 2019-10-24 DIAGNOSIS — Z170 Estrogen receptor positive status [ER+]: Secondary | ICD-10-CM | POA: Diagnosis present

## 2019-10-24 DIAGNOSIS — C50511 Malignant neoplasm of lower-outer quadrant of right female breast: Secondary | ICD-10-CM | POA: Diagnosis present

## 2019-10-24 DIAGNOSIS — M25511 Pain in right shoulder: Secondary | ICD-10-CM | POA: Insufficient documentation

## 2019-10-24 DIAGNOSIS — R293 Abnormal posture: Secondary | ICD-10-CM | POA: Insufficient documentation

## 2019-10-24 NOTE — Patient Instructions (Addendum)
Closed Chain: Shoulder Flexion / Extension - on Wall    Hands on wall, step backward. Return. Stepping causes shoulder flexion and extension Do _5__ times, holding 5 seconds, _2__ times per day.  http://ss.exer.us/265   Copyright  VHI. All rights reserved.  Closed Chain: Shoulder Abduction / Adduction - on Wall    One hand on wall, step to side and return. Stepping causes shoulder to abduct and adduct. Step _5__ times, holding 5 seconds, _2__ times per day.  http://ss.exer.us/267   Copyright  VHI. All rights reserved.               [facility]         [address], [place] [address]         [phone]         Annia Friendly, PT, CLT   After Breast Cancer Class  You can participate 11/04/2019. We will send you a link and you'll need to download the Webex app on your phone or computer.   Scar massage  You can use coconut oil on your scars and gently massage those areas. Try this 1-2 times per day for a few minutes. You can't use coconut oil once you begin radiation.    Home exercise Program  Continue the ones I gave you before surgery but also begin the new ones given today and continue those throughout radiation.   Follow up PT None needed at this time except to participate in the ABC class....BUT START WALKING 30 minutes/day!!

## 2019-10-24 NOTE — Therapy (Signed)
[facility] [address] [place], [place], [address] Phone: [phone]   Fax:  [phone]  Physical Therapy Treatment  Patient Details  Name: Jasmine Sosa MRN: [id number] Date of Birth: 09/23/1951 Referring Provider (PT): Dr. David Newman   Encounter Date: 10/24/2019  PT End of Session - 10/24/19 1053    Visit Number  2    Number of Visits  2    PT Start Time  1010    PT Stop Time  1053    PT Time Calculation (min)  43 min    Activity Tolerance  Patient tolerated treatment well    Behavior During Therapy  WFL for tasks assessed/performed       Past Medical History:  Diagnosis Date  . Family history of breast cancer   . Family history of lung cancer   . Family history of prostate cancer   . Hypertension     Past Surgical History:  Procedure Laterality Date  . BREAST LUMPECTOMY WITH RADIOACTIVE SEED AND SENTINEL LYMPH NODE BIOPSY Right 09/18/2019   Procedure: RIGHT BREAST LUMPECTOMY WITH RADIOACTIVE SEED AND RIGHT AXILLARY SENTINEL LYMPH NODE BIOPSY;  Surgeon: Newman, David, MD;  Location: [facility];  Service: General;  Laterality: Right;  . PARTIAL HYSTERECTOMY    . TONSILLECTOMY AND ADENOIDECTOMY      There were no vitals filed for this visit.  Subjective Assessment - 10/24/19 1017    Subjective  Patient underwent a right lumpectomy and sentinel node biopsy (2 negative nodes) on 09/18/2019. Her oncotype score was 15 so there is no need for chemotherapy but she will begin radiation soon.    Pertinent History  Patient was diagnosed on 08/01/2019 with right grade I invasive ductal carcinoma breast cancer. Patient underwent a right lunmpectomy and sentinel node biopsy (2 negative nodes) on 09/18/2019. It is ER/PR positive and HER2 negative with a Ki67 of 5%. She has a history of right shoulder pain.    Patient Stated Goals  See if my arm is doing ok    Currently in Pain?  No/denies          [facility] PT Assessment - 10/24/19 0001      Assessment   Medical Diagnosis  s/p right lumpectomy and SLNB    Referring Provider (PT)  Dr. David Newman    Onset Date/Surgical Date  09/18/19    Hand Dominance  Right    Prior Therapy  Baselines      Precautions   Precautions  Other (comment)    Precaution Comments  recent surgery      Restrictions   Weight Bearing Restrictions  No      Balance Screen   Has the patient fallen in the past 6 months  No    Has the patient had a decrease in activity level because of a fear of falling?   No    Is the patient reluctant to leave their home because of a fear of falling?   No      Home Environment   Living Environment  Private residence    Living Arrangements  Spouse/significant other    Available Help at Discharge  Family      Prior Function   Level of Independence  Independent    Vocation  Retired    Leisure  She is walking about 2x/week      Cognition   Overall Cognitive Status  Within Functional Limits for tasks assessed        Observation/Other Assessments   Observations  Incisions in right axilla and right lateral breast both appear to be healing very well. No open areas present.      Posture/Postural Control   Posture/Postural Control  Postural limitations    Postural Limitations  Rounded Shoulders;Forward head      ROM / Strength   AROM / PROM / Strength  AROM      AROM   AROM Assessment Site  Shoulder    Right/Left Shoulder  Right    Right Shoulder Extension  49 Degrees    Right Shoulder Flexion  142 Degrees    Right Shoulder ABduction  152 Degrees    Right Shoulder Internal Rotation  64 Degrees    Right Shoulder External Rotation  88 Degrees        LYMPHEDEMA/ONCOLOGY QUESTIONNAIRE - 10/24/19 1024      Type   Cancer Type  Right breast cancer      Surgeries   Lumpectomy Date  09/18/19    Sentinel Lymph Node Biopsy Date  09/18/19    Number Lymph Nodes Removed  2      Treatment   Active Chemotherapy  Treatment  No    Past Chemotherapy Treatment  No    Active Radiation Treatment  No    Past Radiation Treatment  No    Current Hormone Treatment  No    Past Hormone Therapy  No      What other symptoms do you have   Are you Having Heaviness or Tightness  No    Are you having Pain  No    Are you having pitting edema  No    Is it Hard or Difficult finding clothes that fit  No    Do you have infections  No    Is there Decreased scar mobility  No    [place] Sign  No      Lymphedema Assessments   Lymphedema Assessments  Upper extremities      Right Upper Extremity Lymphedema   10 cm Proximal to Olecranon Process  29 cm    Olecranon Process  23.9 cm    10 cm Proximal to Ulnar Styloid Process  21.5 cm    Just Proximal to Ulnar Styloid Process  14.5 cm    Across Hand at [employer]  18.1 cm    At [place] of 2nd Digit  6.1 cm      Left Upper Extremity Lymphedema   10 cm Proximal to Olecranon Process  27.5 cm    Olecranon Process  23.6 cm    10 cm Proximal to Ulnar Styloid Process  19.9 cm    Just Proximal to Ulnar Styloid Process  14.4 cm    Across Hand at [employer]  17.2 cm    At [place] of 2nd Digit  5.7 cm        Quick Dash - 10/24/19 0001    Open a tight or new jar  No difficulty    Do heavy household chores (wash walls, wash floors)  No difficulty    Carry a shopping bag or briefcase  No difficulty    Wash your back  No difficulty    Use a knife to cut food  No difficulty    Recreational activities in which you take some force or impact through your arm, shoulder, or hand (golf, hammering, tennis)  No difficulty    During the past week, to what extent has your arm, shoulder or  hand problem interfered with your normal social activities with family, friends, neighbors, or groups?  Not at all    During the past week, to what extent has your arm, shoulder or hand problem limited your work or other regular daily activities  Not at all    Arm, shoulder, or hand pain.  None     Tingling (pins and needles) in your arm, shoulder, or hand  None    Difficulty Sleeping  No difficulty    DASH Score  0 %                     PT Education - 10/24/19 1033    Education Details  Closed chain flexion and abduction right shoulder    Person(s) Educated  Patient    Methods  Explanation;Demonstration;Handout    Comprehension  Verbalized understanding;Returned demonstration          PT Long Term Goals - 10/24/19 1056      PT LONG TERM GOAL #1   Title  Patient will demonstrate she has regained full shoulder ROM and function post operatively compared to baselines.    Time  8    Period  Weeks    Status  Achieved            Plan - 10/24/19 1054    Clinical Impression Statement  Patient is doing very well s/p right lumpectomy and sentinel node biopsy. She had 2 negative nodes and a low Oncotype score. She will begin radiation soon. Her shoulder ROM and function is back to baseline, there are no signs of lymphedema, and her incisions are healing well. She plans to attend the After Breast Cancer but otherwise has no needs for PT.    PT Treatment/Interventions  ADLs/Self Care Home Management;Therapeutic exercise;Patient/family education    PT Next Visit Plan  D/C    PT Home Exercise Plan  Post op shoulder ROM HEP    Consulted and Agree with Plan of Care  Patient       Patient will benefit from skilled therapeutic intervention in order to improve the following deficits and impairments:  Postural dysfunction, Decreased range of motion, Impaired UE functional use, Pain, Decreased knowledge of precautions  Visit Diagnosis: Malignant neoplasm of lower-outer quadrant of right breast of female, estrogen receptor positive (HCC)  Abnormal posture  Chronic right shoulder pain  Aftercare following surgery for neoplasm     Problem List Patient Active Problem List   Diagnosis Date Noted  . Genetic testing 09/03/2019  . Family history of breast cancer   .  Family history of prostate cancer   . Family history of lung cancer   . Malignant neoplasm of upper-outer quadrant of right breast in female, estrogen receptor positive (HCC) 08/23/2019   PHYSICAL THERAPY DISCHARGE SUMMARY  Visits from Start of Care: 2  Current functional level related to goals / functional outcomes: Goals met; see above for objective findings.   Remaining deficits: None   Education / Equipment: HEP and lymphedema risk reduction  Plan: Patient agrees to discharge.  Patient goals were met. Patient is being discharged due to meeting the stated rehab goals.  ?????        Marti Cooper Smith, PT 10/24/19 10:58 AM  [facility] [address] [place], [place], [address] Phone: [phone]   Fax:  [phone]  Name: Allysson Stovall Sweney MRN: [id number] Date of Birth: 01/06/1951   

## 2019-10-25 ENCOUNTER — Telehealth: Payer: Self-pay | Admitting: *Deleted

## 2019-10-25 NOTE — Telephone Encounter (Signed)
Spoke with Pamala Hurry at Dr. Lula Olszewski office to ensure they received referral. Pamala Hurry will be calling pt with appt today and has referral.

## 2019-10-29 ENCOUNTER — Encounter: Payer: Self-pay | Admitting: *Deleted

## 2019-10-29 ENCOUNTER — Telehealth: Payer: Self-pay | Admitting: *Deleted

## 2019-10-29 NOTE — Telephone Encounter (Signed)
Patient will be seeing Dr. Orlene Erm 1/13 to discuss xrt

## 2019-11-05 ENCOUNTER — Encounter: Payer: Self-pay | Admitting: *Deleted

## 2019-11-25 ENCOUNTER — Encounter: Payer: Self-pay | Admitting: *Deleted

## 2019-11-26 ENCOUNTER — Telehealth: Payer: Self-pay | Admitting: Hematology and Oncology

## 2019-11-26 NOTE — Telephone Encounter (Signed)
I talk with patient regarding schedule  

## 2019-11-27 ENCOUNTER — Encounter: Payer: Self-pay | Admitting: *Deleted

## 2019-12-16 NOTE — Progress Notes (Signed)
Patient Care Team: Jasmine Pac, NP as PCP - General (Internal Medicine) Mauro Kaufmann, RN as Oncology Nurse Navigator Rockwell Germany, RN as Oncology Nurse Navigator Nicholas Lose, MD as Consulting Physician (Hematology and Oncology) Gery Pray, MD as Consulting Physician (Radiation Oncology) Alphonsa Overall, MD as Consulting Physician (General Surgery)  DIAGNOSIS:    ICD-10-CM   1. Malignant neoplasm of upper-outer quadrant of right breast in female, estrogen receptor positive ([place])  C50.411    Z17.0     SUMMARY OF ONCOLOGIC HISTORY: Oncology History  Malignant neoplasm of upper-outer quadrant of right breast in female, estrogen receptor positive ([place])  08/23/2019 Initial Diagnosis   Routine screening mammogram detected 0.8cm mass in the 8:45 position of the right breast, no abnormal right axillary lymph nodes. 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    09/03/2019 Genetic Testing   Negative genetic testing:  No pathogenic variants identified on the [facility] Breast Cancer STAT panel or the Common Hereditary Cancers panel. The report date is 09/03/2019.  The Breast Cancer STAT panel offered by [facility] includes sequencing and rearrangement analysis for the following 9 genes:  ATM, BRCA1, BRCA2, CDH1, CHEK2, PALB2, PTEN, STK11 and TP53.  The Common Hereditary Cancers Panel offered by [facility] includes sequencing and/or deletion duplication testing of the following 48 genes: APC, ATM, AXIN2, BARD1, BMPR1A, BRCA1, BRCA2, BRIP1, CDH1, CDK4, CDKN2A (p14ARF), CDKN2A (p16INK4a), CHEK2, CTNNA1, DICER1, EPCAM (Deletion/duplication testing only), GREM1 (promoter region deletion/duplication testing only), KIT, MEN1, MLH1, MSH2, MSH3, MSH6, MUTYH, NBN, NF1, NHTL1, PALB2, PDGFRA, PMS2, POLD1, POLE, PTEN, RAD50, RAD51C, RAD51D, RNF43, SDHB, SDHC, SDHD, SMAD4, SMARCA4. STK11, TP53, TSC1, TSC2, and VHL.  The following genes were evaluated  for sequence changes only: SDHA and HOXB13 c.251G>A variant only.   09/18/2019 Surgery   Right lumpectomy [name]): IDC, grade 1, 1.2cm, with low grade DCIS, clear margins, 2 right axillary lymph nodes negative.   09/18/2019 Cancer Staging   Staging form: Breast, AJCC 8th Edition - Pathologic stage from 09/18/2019: Stage IA (pT1c, pN0, cM0, G1, ER+, PR+, HER2-) - Signed by Gardenia Phlegm, NP on 10/02/2019   10/21/2019 Oncotype testing   Oncotype score of 15/4%.    Radiation Therapy   Radiation at [place]     CHIEF COMPLIANT: Follow-up of right breast cancer to discuss antiestrogen therapy  INTERVAL HISTORY: Annalysia Willenbring is a 69 y.o. with above-mentioned history of right breast cancer who underwent a right lumpectomy and is currently undergoing radiation treatment at [facility]. She presents to the clinic today to discuss antiestrogen therapy.  She has radiation dermatitis   ALLERGIES:  is allergic to ace inhibitors; penicillins; and sulfa antibiotics.  MEDICATIONS:  Current Outpatient Medications  Medication Sig Dispense Refill  . Calcium Carb-Cholecalciferol (CALCIUM 600+D3 PO) Take by mouth.    . levothyroxine (SYNTHROID) 75 MCG tablet Take 75 mcg by mouth daily before breakfast.    . losartan (COZAAR) 100 MG tablet Take 100 mg by mouth daily.    . traMADol (ULTRAM) 50 MG tablet Take 1 tablet (50 mg total) by mouth every 6 (six) hours as needed for moderate pain. (Patient not taking: Reported on 10/24/2019) 12 tablet 1   No current facility-administered medications for this visit.    PHYSICAL EXAMINATION: ECOG PERFORMANCE STATUS: 1 - Symptomatic but completely ambulatory  Vitals:   12/17/19 1520  BP: 139/75  Pulse: 83  Resp: 17  Temp: 98.3 F (36.8 C)  SpO2: 100%   Filed Weights   12/17/19 1520  Weight: 150 lb 12.8 oz (68.4 kg)    LABORATORY DATA:  I have reviewed the data as listed CMP Latest Ref Rng & Units 08/28/2019  Glucose 70 - 99 mg/dL 91  BUN  8 - 23 mg/dL 16  Creatinine 0.44 - 1.00 mg/dL 0.79  Sodium 135 - 145 mmol/L 141  Potassium 3.5 - 5.1 mmol/L 3.8  Chloride 98 - 111 mmol/L 104  CO2 22 - 32 mmol/L 26  Calcium 8.9 - 10.3 mg/dL 9.7  Total Protein 6.5 - 8.1 g/dL 7.3  Total Bilirubin 0.3 - 1.2 mg/dL 0.4  Alkaline Phos 38 - 126 U/L 81  AST 15 - 41 U/L 16  ALT 0 - 44 U/L 13    Lab Results  Component Value Date   WBC 8.8 08/28/2019   HGB 13.3 08/28/2019   HCT 40.5 08/28/2019   MCV 91.2 08/28/2019   PLT 308 08/28/2019   NEUTROABS 4.6 08/28/2019    ASSESSMENT & PLAN:  Malignant neoplasm of upper-outer quadrant of right breast in female, estrogen receptor positive ([place]) 08/23/2019:Routine screening mammogram detected 0.8cm mass in the 8:45 position of the right breast, no abnormal right axillary lymph nodes. 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 T1b N0 stage Ia clinical stage  09/18/19: Right lumpectomy [name]): IDC, grade 1, 1.2cm, with low grade DCIS, clear margins, 2 right axillary lymph nodes negative.HER-2 - (1+), ER+ 90%, PR+ 85%, Ki67 5%. T1cN0 Stage 1A Oncotype DX recurrence score 15: Low risk, 4% distant recurrence. Adjuvant radiation therapy   Recommendation: Adjuvant antiestrogen therapy with anastrozole 1 mg daily We discussed the risks and benefits of anti-estrogen therapy with aromatase inhibitors. These include but not limited to insomnia, hot flashes, mood changes, vaginal dryness, bone density loss, and weight gain. We strongly believe that the benefits far outweigh the risks. Patient understands these risks and consented to starting treatment. Planned treatment duration is 5-7 years.  Patient agreed to participate in antiestrogen therapy compliance study with the patient reported outcomes through my chart  Return to clinic in 3 months for survivorship care plan visit     No orders of the defined types were placed in this  encounter.  The patient has a good understanding of the overall plan. she agrees with it. she will call with any problems that may develop before the next visit here.  Total time spent: 20 mins including face to face time and time spent for planning, charting and coordination of care  Nicholas Lose, MD 12/17/2019  I, Cloyde Reams Dorshimer, am acting as scribe for Dr. Nicholas Lose.  I have reviewed the above documentation for accuracy and completeness, and I agree with the above.

## 2019-12-17 ENCOUNTER — Inpatient Hospital Stay: Payer: Medicare Other | Attending: Hematology and Oncology | Admitting: Hematology and Oncology

## 2019-12-17 ENCOUNTER — Other Ambulatory Visit: Payer: Self-pay

## 2019-12-17 DIAGNOSIS — C50411 Malignant neoplasm of upper-outer quadrant of right female breast: Secondary | ICD-10-CM | POA: Diagnosis not present

## 2019-12-17 DIAGNOSIS — Z923 Personal history of irradiation: Secondary | ICD-10-CM | POA: Insufficient documentation

## 2019-12-17 DIAGNOSIS — Z170 Estrogen receptor positive status [ER+]: Secondary | ICD-10-CM | POA: Diagnosis not present

## 2019-12-17 MED ORDER — ANASTROZOLE 1 MG PO TABS: 1.0000 mg | ORAL_TABLET | Freq: Every day | ORAL | 3 refills | Status: DC

## 2019-12-17 NOTE — Assessment & Plan Note (Signed)
08/23/2019:Routine screening mammogram detected 0.8cm mass in the 8:45 position of the right breast, no abnormal right axillary lymph nodes. 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 T1b N0 stage Ia clinical stage  09/18/19: Right lumpectomy Jasmine Sosa): IDC, grade 1, 1.2cm, with low grade DCIS, clear margins, 2 right axillary lymph nodes negative.HER-2 - (1+), ER+ 90%, PR+ 85%, Ki67 5%. T1cN0 Stage 1A Oncotype DX recurrence score 15: Low risk, 4% distant recurrence. Adjuvant radiation therapy with aspirin  Recommendation: Adjuvant antiestrogen therapy with anastrozole 1 mg daily We discussed the risks and benefits of anti-estrogen therapy with aromatase inhibitors. These include but not limited to insomnia, hot flashes, mood changes, vaginal dryness, bone density loss, and weight gain. We strongly believe that the benefits far outweigh the risks. Patient understands these risks and consented to starting treatment. Planned treatment duration is 5-7 years.  Patient agreed to participate in antiestrogen therapy compliance study with the patient reported outcomes through my chart  Return to clinic in 3 months for survivorship care plan visit

## 2019-12-18 ENCOUNTER — Other Ambulatory Visit: Payer: Self-pay | Admitting: Adult Health

## 2019-12-18 ENCOUNTER — Telehealth: Payer: Self-pay | Admitting: Hematology and Oncology

## 2019-12-18 ENCOUNTER — Encounter: Payer: Self-pay | Admitting: *Deleted

## 2019-12-18 ENCOUNTER — Encounter (INDEPENDENT_AMBULATORY_CARE_PROVIDER_SITE_OTHER): Payer: Self-pay

## 2019-12-18 ENCOUNTER — Telehealth: Payer: Self-pay | Admitting: Adult Health

## 2019-12-18 NOTE — Telephone Encounter (Signed)
Called patient and walked her through how to get my chart app on her phone.  I also walked her through where to go to answer the my chart care companion surveys on the AET study. Patient was able to locate the surveys and to start them.    Wilber Bihari, NP

## 2019-12-18 NOTE — Telephone Encounter (Signed)
I talk with patient regarding schedule  

## 2020-01-15 ENCOUNTER — Encounter (INDEPENDENT_AMBULATORY_CARE_PROVIDER_SITE_OTHER): Payer: Self-pay

## 2020-01-29 ENCOUNTER — Encounter (INDEPENDENT_AMBULATORY_CARE_PROVIDER_SITE_OTHER): Payer: Self-pay

## 2020-02-05 ENCOUNTER — Encounter (INDEPENDENT_AMBULATORY_CARE_PROVIDER_SITE_OTHER): Payer: Self-pay

## 2020-02-12 ENCOUNTER — Encounter (INDEPENDENT_AMBULATORY_CARE_PROVIDER_SITE_OTHER): Payer: Self-pay

## 2020-02-19 ENCOUNTER — Encounter: Payer: Self-pay | Admitting: Adult Health

## 2020-02-22 ENCOUNTER — Encounter (INDEPENDENT_AMBULATORY_CARE_PROVIDER_SITE_OTHER): Payer: Self-pay

## 2020-02-26 ENCOUNTER — Encounter (INDEPENDENT_AMBULATORY_CARE_PROVIDER_SITE_OTHER): Payer: Self-pay

## 2020-03-04 ENCOUNTER — Encounter (INDEPENDENT_AMBULATORY_CARE_PROVIDER_SITE_OTHER): Payer: Self-pay

## 2020-03-11 ENCOUNTER — Encounter (INDEPENDENT_AMBULATORY_CARE_PROVIDER_SITE_OTHER): Payer: Self-pay

## 2020-03-18 ENCOUNTER — Inpatient Hospital Stay: Payer: Medicare Other | Attending: Hematology and Oncology | Admitting: Adult Health

## 2020-03-18 ENCOUNTER — Encounter: Payer: Self-pay | Admitting: Adult Health

## 2020-03-18 ENCOUNTER — Encounter (INDEPENDENT_AMBULATORY_CARE_PROVIDER_SITE_OTHER): Payer: Self-pay

## 2020-03-18 ENCOUNTER — Other Ambulatory Visit: Payer: Self-pay

## 2020-03-18 ENCOUNTER — Telehealth: Payer: Self-pay | Admitting: *Deleted

## 2020-03-18 VITALS — BP 154/73 | HR 95 | Temp 98.5°F | Resp 18 | Ht 61.0 in | Wt 154.5 lb

## 2020-03-18 DIAGNOSIS — C773 Secondary and unspecified malignant neoplasm of axilla and upper limb lymph nodes: Secondary | ICD-10-CM | POA: Insufficient documentation

## 2020-03-18 DIAGNOSIS — Z79811 Long term (current) use of aromatase inhibitors: Secondary | ICD-10-CM | POA: Insufficient documentation

## 2020-03-18 DIAGNOSIS — Z801 Family history of malignant neoplasm of trachea, bronchus and lung: Secondary | ICD-10-CM | POA: Insufficient documentation

## 2020-03-18 DIAGNOSIS — B029 Zoster without complications: Secondary | ICD-10-CM | POA: Diagnosis not present

## 2020-03-18 DIAGNOSIS — Z923 Personal history of irradiation: Secondary | ICD-10-CM | POA: Insufficient documentation

## 2020-03-18 DIAGNOSIS — I10 Essential (primary) hypertension: Secondary | ICD-10-CM | POA: Diagnosis not present

## 2020-03-18 DIAGNOSIS — Z170 Estrogen receptor positive status [ER+]: Secondary | ICD-10-CM | POA: Diagnosis not present

## 2020-03-18 DIAGNOSIS — R21 Rash and other nonspecific skin eruption: Secondary | ICD-10-CM | POA: Insufficient documentation

## 2020-03-18 DIAGNOSIS — C50411 Malignant neoplasm of upper-outer quadrant of right female breast: Secondary | ICD-10-CM | POA: Diagnosis not present

## 2020-03-18 MED ORDER — GABAPENTIN 100 MG PO CAPS: 100.0000 mg | ORAL_CAPSULE | Freq: Every day | ORAL | 0 refills | Status: DC

## 2020-03-18 MED ORDER — TRIAMCINOLONE ACETONIDE 0.5 % EX OINT: 1.0000 "application " | TOPICAL_OINTMENT | Freq: Two times a day (BID) | CUTANEOUS | 0 refills | Status: AC

## 2020-03-18 MED ORDER — VALACYCLOVIR HCL 1 G PO TABS: 1000.0000 mg | ORAL_TABLET | Freq: Three times a day (TID) | ORAL | 0 refills | Status: DC

## 2020-03-18 NOTE — Progress Notes (Signed)
[facility] Follow up:    [name], NP [address] [place] [address]   DIAGNOSIS: Cancer Staging Malignant neoplasm of upper-outer quadrant of right breast in female, estrogen receptor positive ([place]) Staging form: Breast, AJCC 8th Edition - Clinical stage from 08/28/2019: Stage IA (cT1b, cN0, cM0, G1, ER+, PR+, HER2-) - Unsigned - Pathologic stage from 09/18/2019: Stage IA (pT1c, pN0, cM0, G1, ER+, PR+, HER2-) - Signed by Gardenia Phlegm, NP on 10/02/2019   SUMMARY OF ONCOLOGIC HISTORY: Oncology History  Malignant neoplasm of upper-outer quadrant of right breast in female, estrogen receptor positive ([place])  08/23/2019 Initial Diagnosis   Routine screening mammogram detected 0.8cm mass in the 8:45 position of the right breast, no abnormal right axillary lymph nodes. 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    09/03/2019 Genetic Testing   Negative genetic testing:  No pathogenic variants identified on the [facility] Breast Cancer STAT panel or the Common Hereditary Cancers panel. The report date is 09/03/2019.  The Breast Cancer STAT panel offered by [facility] includes sequencing and rearrangement analysis for the following 9 genes:  ATM, BRCA1, BRCA2, CDH1, CHEK2, PALB2, PTEN, STK11 and TP53.  The Common Hereditary Cancers Panel offered by [facility] includes sequencing and/or deletion duplication testing of the following 48 genes: APC, ATM, AXIN2, BARD1, BMPR1A, BRCA1, BRCA2, BRIP1, CDH1, CDK4, CDKN2A (p14ARF), CDKN2A (p16INK4a), CHEK2, CTNNA1, DICER1, EPCAM (Deletion/duplication testing only), GREM1 (promoter region deletion/duplication testing only), KIT, MEN1, MLH1, MSH2, MSH3, MSH6, MUTYH, NBN, NF1, NHTL1, PALB2, PDGFRA, PMS2, POLD1, POLE, PTEN, RAD50, RAD51C, RAD51D, RNF43, SDHB, SDHC, SDHD, SMAD4, SMARCA4. STK11, TP53, TSC1, TSC2, and VHL.  The following genes were  evaluated for sequence changes only: SDHA and HOXB13 c.251G>A variant only.   09/18/2019 Surgery   Right lumpectomy [name]) [phone]): IDC, grade 1, 1.2cm, with low grade DCIS, clear margins, 2 right axillary lymph nodes negative.   09/18/2019 Cancer Staging   Staging form: Breast, AJCC 8th Edition - Pathologic stage from 09/18/2019: Stage IA (pT1c, pN0, cM0, G1, ER+, PR+, HER2-)   10/21/2019 Oncotype testing   The Oncotype DX score was 15 predicting a risk of outside the breast recurrence over the next 9 years of 4% if the patient's only systemic therapy is tamoxifen for 5 years.     Radiation Therapy   Radiation at [facility] [address]   12/2019 - 12/2024 Anti-estrogen oral therapy   Anastrozole      CURRENT THERAPY:  Anastrozole  INTERVAL HISTORY: Jasmine Sosa 69 y.o. female returns for evaluation of right breast rash along her breast and into her axilla.  She says she first noted it this past Saturday and it has worsened and is accompanied by stabbing pain.  She is scratching it constantly and wonders what she can do about it.  She has taken benadryl and is applying hydrocortisone cream, and is very much looking for some relief.     Patient Active Problem List   Diagnosis Date Noted  . Genetic testing 09/03/2019  . Family history of breast cancer   . Family history of prostate cancer   . Family history of lung cancer   . Malignant neoplasm of upper-outer quadrant of right breast in female, estrogen receptor positive ([place]) 08/23/2019    is allergic to ace inhibitors; penicillins; and sulfa antibiotics.  MEDICAL HISTORY: Past Medical History:  Diagnosis Date  . Family history of breast cancer   .  Family history of lung cancer   . Family history of prostate cancer   . Hypertension     SURGICAL HISTORY: Past Surgical History:  Procedure Laterality Date  . BREAST LUMPECTOMY WITH RADIOACTIVE SEED AND SENTINEL LYMPH NODE BIOPSY Right 09/18/2019   Procedure: RIGHT BREAST  LUMPECTOMY WITH RADIOACTIVE SEED AND RIGHT AXILLARY SENTINEL LYMPH NODE BIOPSY;  Surgeon: Alphonsa Overall, MD;  Location: [facility];  Service: General;  Laterality: Right;  . PARTIAL HYSTERECTOMY    . TONSILLECTOMY AND ADENOIDECTOMY      SOCIAL HISTORY: Social History   Socioeconomic History  . Marital status: Married    Spouse name: Not on file  . Number of children: Not on file  . Years of education: Not on file  . Highest education level: Not on file  Occupational History  . Not on file  Tobacco Use  . Smoking status: Never Smoker  . Smokeless tobacco: Never Used  Substance and Sexual Activity  . Alcohol use: Not Currently  . Drug use: Never  . Sexual activity: Not on file  Other Topics Concern  . Not on file  Social History Narrative  . Not on file   Social Determinants of Health   Financial Resource Strain:   . Difficulty of Paying Living Expenses:   Food Insecurity:   . Worried About Charity fundraiser in the Last Year:   . Arboriculturist in the Last Year:   Transportation Needs:   . Film/video editor (Medical):   Marland Kitchen Lack of Transportation (Non-Medical):   Physical Activity:   . Days of Exercise per Week:   . Minutes of Exercise per Session:   Stress:   . Feeling of Stress :   Social Connections:   . Frequency of Communication with Friends and Family:   . Frequency of Social Gatherings with Friends and Family:   . Attends Religious Services:   . Active Member of Clubs or Organizations:   . Attends Archivist Meetings:   Marland Kitchen Marital Status:   Intimate Partner Violence:   . Fear of Current or Ex-Partner:   . Emotionally Abused:   Marland Kitchen Physically Abused:   . Sexually Abused:     FAMILY HISTORY: Family History  Problem Relation Age of Onset  . Breast cancer Father 17       normal genetic testing in 2011  . Breast cancer Sister 10       second diagnosis in other breast at age 48  . Breast cancer Paternal Aunt 75  . Lung cancer  Paternal Uncle   . Other Brother        brain tumor in his 62s  . Lung cancer Maternal Uncle        smoker  . Aneurysm Maternal Grandmother   . Prostate cancer Paternal Uncle   . Cancer Paternal Uncle        unknown type    Review of Systems  Constitutional: Negative for appetite change, chills, fatigue, fever and unexpected weight change.  HENT:   Negative for hearing loss and mouth sores.   Eyes: Negative for eye problems and icterus.  Respiratory: Negative for chest tightness, cough and shortness of breath.   Cardiovascular: Negative for chest pain, leg swelling and palpitations.  Gastrointestinal: Negative for abdominal distention.  Endocrine: Negative for hot flashes.  Genitourinary: Negative for difficulty urinating.   Musculoskeletal: Negative for arthralgias.  Skin: Negative for itching.  Neurological: Negative for dizziness, extremity weakness, headaches  and numbness.  Hematological: Negative for adenopathy.  Psychiatric/Behavioral: The patient is not nervous/anxious.       PHYSICAL EXAMINATION  ECOG PERFORMANCE STATUS: 1 - Symptomatic but completely ambulatory  Vitals:   03/18/20 1519  BP: (!) 154/73  Pulse: 95  Resp: 18  Temp: 98.5 F (36.9 C)  SpO2: 99%    Physical Exam Constitutional:      General: She is not in acute distress.    Appearance: Normal appearance. She is not toxic-appearing.  HENT:     Head: Normocephalic and atraumatic.  Eyes:     General: No scleral icterus. Cardiovascular:     Rate and Rhythm: Normal rate and regular rhythm.     Pulses: Normal pulses.     Heart sounds: Normal heart sounds.  Pulmonary:     Effort: Pulmonary effort is normal.     Breath sounds: Normal breath sounds.     Comments: Right breast with linear vesicular rash and into axilla, no crusting at this point, erythematous Abdominal:     General: Abdomen is flat. Bowel sounds are normal.     Palpations: Abdomen is soft.  Musculoskeletal:     Cervical back:  Neck supple.  Lymphadenopathy:     Cervical: No cervical adenopathy.  Skin:    General: Skin is warm and dry.     Capillary Refill: Capillary refill takes less than 2 seconds.     Findings: No rash.  Neurological:     General: No focal deficit present.     Mental Status: She is alert.  Psychiatric:        Mood and Affect: Mood normal.        Behavior: Behavior normal.     LABORATORY DATA:  CBC    Component Value Date/Time   WBC 8.8 08/28/2019 0825   RBC 4.44 08/28/2019 0825   HGB 13.3 08/28/2019 0825   HCT 40.5 08/28/2019 0825   PLT 308 08/28/2019 0825   MCV 91.2 08/28/2019 0825   MCH 30.0 08/28/2019 0825   MCHC 32.8 08/28/2019 0825   RDW 12.6 08/28/2019 0825   LYMPHSABS 3.3 08/28/2019 0825   MONOABS 0.6 08/28/2019 0825   EOSABS 0.2 08/28/2019 0825   BASOSABS 0.1 08/28/2019 0825    CMP     Component Value Date/Time   NA 141 08/28/2019 0825   K 3.8 08/28/2019 0825   CL 104 08/28/2019 0825   CO2 26 08/28/2019 0825   GLUCOSE 91 08/28/2019 0825   BUN 16 08/28/2019 0825   CREATININE 0.79 08/28/2019 0825   CALCIUM 9.7 08/28/2019 0825   PROT 7.3 08/28/2019 0825   ALBUMIN 4.3 08/28/2019 0825   AST 16 08/28/2019 0825   ALT 13 08/28/2019 0825   ALKPHOS 81 08/28/2019 0825   BILITOT 0.4 08/28/2019 0825   GFRNONAA >60 08/28/2019 0825   GFRAA >60 08/28/2019 0825         ASSESSMENT and THERAPY PLAN:   Malignant neoplasm of upper-outer quadrant of right breast in female, estrogen receptor positive ([place]) 08/23/2019:Routine screening mammogram detected 0.8cm mass in the 8:45 position of the right breast, no abnormal right axillary lymph nodes. 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 T1b N0 stage Ia clinical stage  09/18/19: Right lumpectomy [name]): IDC, grade 1, 1.2cm, with low grade DCIS, clear margins, 2 right axillary lymph nodes negative.HER-2 - (1+), ER+ 90%, PR+ 85%, Ki67 5%. T1cN0 Stage  1A Oncotype DX recurrence score  15: Low risk, 4% distant recurrence. Adjuvant radiation therapy with aspirin  Recommendation: Adjuvant antiestrogen therapy with anastrozole 1 mg daily  She will continue anastrozole daily.  Her rash is due to shingles.  I sent in Gabapentin 116m 1-3 tabs qhs, Kenalog ointment, and Valtrex TID.  She will start these and we will determine if we need to add anything else if she continues to have issues.  She will continue to take oral benadryl for the itching.  I reviewed with her common reasons that patients can have a shingles outbreak and one of those includes stress.  She began crying and we spent a long time discussing her stressors with her family, illnesses, and her caregiving for her mother.  I gave her reassurance and encouragement today.    She has her SCP next week and we will f/u then, and she will undergo screening at that point for distress as a standard part of that visit.     All questions were answered. The patient knows to call the clinic with any problems, questions or concerns. We can certainly see the patient much sooner if necessary.  Total encounter time: 45 minutes*  LWilber Bihari NP 03/20/20 1:40 PM Medical Oncology and H[facility] [place] [place] [address]Tel. [phone]   Fax. [phone] *Total Encounter Time as defined by the Centers for Medicare and Medicaid Services includes, in addition to the face-to-face time of a patient visit (documented in the note above) non-face-to-face time: obtaining and reviewing outside history, ordering and reviewing medications, tests or procedures, care coordination (communications with other health care professionals or caregivers) and documentation in the medical record.

## 2020-03-18 NOTE — Patient Instructions (Signed)
Shingles  Shingles, which is also known as herpes zoster, is an infection that causes a painful skin rash and fluid-filled blisters. It is caused by a virus. Shingles only develops in people who:  Have had chickenpox.  Have been given a medicine to protect against chickenpox (have been vaccinated). Shingles is rare in this group. What are the causes? Shingles is caused by varicella-zoster virus (VZV). This is the same virus that causes chickenpox. After a person is exposed to VZV, the virus stays in the body in an inactive (dormant) state. Shingles develops if the virus is reactivated. This can happen many years after the first (initial) exposure to VZV. It is not known what causes this virus to be reactivated. What increases the risk? People who have had chickenpox or received the chickenpox vaccine are at risk for shingles. Shingles infection is more common in people who:  Are older than age 60.  Have a weakened disease-fighting system (immune system), such as people with: ? HIV. ? AIDS. ? Cancer.  Are taking medicines that weaken the immune system, such as transplant medicines.  Are experiencing a lot of stress. What are the signs or symptoms? Early symptoms of this condition include itching, tingling, and pain in an area on your skin. Pain may be described as burning, stabbing, or throbbing. A few days or weeks after early symptoms start, a painful red rash appears. The rash is usually on one side of the body and has a band-like or belt-like pattern. The rash eventually turns into fluid-filled blisters that break open, change into scabs, and dry up in about 2-3 weeks. At any time during the infection, you may also develop:  A fever.  Chills.  A headache.  An upset stomach. How is this diagnosed? This condition is diagnosed with a skin exam. Skin or fluid samples may be taken from the blisters before a diagnosis is made. These samples are examined under a microscope or sent to  a lab for testing. How is this treated? The rash may last for several weeks. There is not a specific cure for this condition. Your health care provider will probably prescribe medicines to help you manage pain, recover more quickly, and avoid long-term problems. Medicines may include:  Antiviral drugs.  Anti-inflammatory drugs.  Pain medicines.  Anti-itching medicines (antihistamines). If the area involved is on your face, you may be referred to a specialist, such as an eye doctor (ophthalmologist) or an ear, nose, and throat (ENT) doctor (otolaryngologist) to help you avoid eye problems, chronic pain, or disability. Follow these instructions at home: Medicines  Take over-the-counter and prescription medicines only as told by your health care provider.  Apply an anti-itch cream or numbing cream to the affected area as told by your health care provider. Relieving itching and discomfort   Apply cold, wet cloths (cold compresses) to the area of the rash or blisters as told by your health care provider.  Cool baths can be soothing. Try adding baking soda or dry oatmeal to the water to reduce itching. Do not bathe in hot water. Blister and rash care  Keep your rash covered with a loose bandage (dressing). Wear loose-fitting clothing to help ease the pain of material rubbing against the rash.  Keep your rash and blisters clean by washing the area with mild soap and cool water as told by your health care provider.  Check your rash every day for signs of infection. Check for: ? More redness, swelling, or pain. ? Fluid   or blood. ? Warmth. ? Pus or a bad smell.  Do not scratch your rash or pick at your blisters. To help avoid scratching: ? Keep your fingernails clean and cut short. ? Wear gloves or mittens while you sleep, if scratching is a problem. General instructions  Rest as told by your health care provider.  Keep all follow-up visits as told by your health care provider. This  is important.  Wash your hands often with soap and water. If soap and water are not available, use hand sanitizer. Doing this lowers your chance of getting a bacterial skin infection.  Before your blisters change into scabs, your shingles infection can cause chickenpox in people who have never had it or have never been vaccinated against it. To prevent this from happening, avoid contact with other people, especially: ? Babies. ? Pregnant women. ? Children who have eczema. ? Elderly people who have transplants. ? People who have chronic illnesses, such as cancer or AIDS. Contact a health care provider if:  Your pain is not relieved with prescribed medicines.  Your pain does not get better after the rash heals.  You have signs of infection in the rash area, such as: ? More redness, swelling, or pain around the rash. ? Fluid or blood coming from the rash. ? The rash area feeling warm to the touch. ? Pus or a bad smell coming from the rash. Get help right away if:  The rash is on your face or nose.  You have facial pain, pain around your eye area, or loss of feeling on one side of your face.  You have difficulty seeing.  You have ear pain or have ringing in your ear.  You have a loss of taste.  Your condition gets worse. Summary  Shingles, which is also known as herpes zoster, is an infection that causes a painful skin rash and fluid-filled blisters.  This condition is diagnosed with a skin exam. Skin or fluid samples may be taken from the blisters and examined before the diagnosis is made.  Keep your rash covered with a loose bandage (dressing). Wear loose-fitting clothing to help ease the pain of material rubbing against the rash.  Before your blisters change into scabs, your shingles infection can cause chickenpox in people who have never had it or have never been vaccinated against it. This information is not intended to replace advice given to you by your health care  provider. Make sure you discuss any questions you have with your health care provider. Document Revised: 01/25/2019 Document Reviewed: 06/07/2017 Elsevier Patient Education  2020 Elsevier Inc.  

## 2020-03-18 NOTE — Telephone Encounter (Signed)
Received call from pt with complaint of rash x 1 week.  Pt states the rash is under her right breast and goes up to her neck.  Pt states the rash is red and itchy and not relieved by p.o benadryl or OTC creams.  Pt also experiencing facial flushing.  Pt denies recent injury or trauma.  Per MD pt to be examined by NP today for further evaluation and treatment.  Apt scheduled and pt verbalized understanding.

## 2020-03-19 ENCOUNTER — Telehealth: Payer: Self-pay

## 2020-03-19 ENCOUNTER — Telehealth: Payer: Self-pay | Admitting: Adult Health

## 2020-03-19 NOTE — Telephone Encounter (Signed)
Called [facility] in [place] for clarification on Gabapentin. Per Wilber Bihari, NP, she wants pt to take 1-3 capsules QHS. Pharmacy tech took orders.

## 2020-03-19 NOTE — Telephone Encounter (Signed)
No 6/2 los. No changes made to pt's schedule.

## 2020-03-20 NOTE — Assessment & Plan Note (Signed)
08/23/2019:Routine screening mammogram detected 0.8cm mass in the 8:45 position of the right breast, no abnormal right axillary lymph nodes. 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 T1b N0 stage Ia clinical stage  09/18/19: Right lumpectomy Jasmine Sosa): IDC, grade 1, 1.2cm, with low grade DCIS, clear margins, 2 right axillary lymph nodes negative.HER-2 - (1+), ER+ 90%, PR+ 85%, Ki67 5%. T1cN0 Stage 1A Oncotype DX recurrence score 15: Low risk, 4% distant recurrence. Adjuvant radiation therapy with aspirin  Recommendation: Adjuvant antiestrogen therapy with anastrozole 1 mg daily  She will continue anastrozole daily.  Her rash is due to shingles.  I sent in Gabapentin 148m 1-3 tabs qhs, Kenalog ointment, and Valtrex TID.  She will start these and we will determine if we need to add anything else if she continues to have issues.  She will continue to take oral benadryl for the itching.  I reviewed with her common reasons that patients can have a shingles outbreak and one of those includes stress.  She began crying and we spent a long time discussing her stressors with her family, illnesses, and her caregiving for her mother.  I gave her reassurance and encouragement today.    She has her SCP next week and we will f/u then, and she will undergo screening at that point for distress as a standard part of that visit.

## 2020-03-23 ENCOUNTER — Encounter (INDEPENDENT_AMBULATORY_CARE_PROVIDER_SITE_OTHER): Payer: Self-pay

## 2020-03-24 ENCOUNTER — Encounter: Payer: Medicare Other | Admitting: Adult Health

## 2020-03-25 ENCOUNTER — Inpatient Hospital Stay (HOSPITAL_BASED_OUTPATIENT_CLINIC_OR_DEPARTMENT_OTHER): Payer: Medicare Other | Admitting: Adult Health

## 2020-03-25 DIAGNOSIS — Z170 Estrogen receptor positive status [ER+]: Secondary | ICD-10-CM

## 2020-03-25 DIAGNOSIS — C50411 Malignant neoplasm of upper-outer quadrant of right female breast: Secondary | ICD-10-CM | POA: Diagnosis not present

## 2020-03-25 NOTE — Progress Notes (Signed)
SURVIVORSHIP VISIT:   BRIEF ONCOLOGIC HISTORY:  Oncology History  Malignant neoplasm of upper-outer quadrant of right breast in female, estrogen receptor positive ([place])  08/23/2019 Initial Diagnosis   Routine screening mammogram detected 0.8cm mass in the 8:45 position of the right breast, no abnormal right axillary lymph nodes. 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    09/03/2019 Genetic Testing   Negative genetic testing:  No pathogenic variants identified on the [facility] Breast Cancer STAT panel or the Common Hereditary Cancers panel. The report date is 09/03/2019.  The Breast Cancer STAT panel offered by [facility] includes sequencing and rearrangement analysis for the following 9 genes:  ATM, BRCA1, BRCA2, CDH1, CHEK2, PALB2, PTEN, STK11 and TP53.  The Common Hereditary Cancers Panel offered by [facility] includes sequencing and/or deletion duplication testing of the following 48 genes: APC, ATM, AXIN2, BARD1, BMPR1A, BRCA1, BRCA2, BRIP1, CDH1, CDK4, CDKN2A (p14ARF), CDKN2A (p16INK4a), CHEK2, CTNNA1, DICER1, EPCAM (Deletion/duplication testing only), GREM1 (promoter region deletion/duplication testing only), KIT, MEN1, MLH1, MSH2, MSH3, MSH6, MUTYH, NBN, NF1, NHTL1, PALB2, PDGFRA, PMS2, POLD1, POLE, PTEN, RAD50, RAD51C, RAD51D, RNF43, SDHB, SDHC, SDHD, SMAD4, SMARCA4. STK11, TP53, TSC1, TSC2, and VHL.  The following genes were evaluated for sequence changes only: SDHA and HOXB13 c.251G>A variant only.   09/18/2019 Surgery   Right lumpectomy [name]) [phone]): IDC, grade 1, 1.2cm, with low grade DCIS, clear margins, 2 right axillary lymph nodes negative.   09/18/2019 Cancer Staging   Staging form: Breast, AJCC 8th Edition - Pathologic stage from 09/18/2019: Stage IA (pT1c, pN0, cM0, G1, ER+, PR+, HER2-)   10/21/2019 Oncotype testing   The Oncotype DX score was 15 predicting a risk of outside the breast recurrence over the next 9  years of 4% if the patient's only systemic therapy is tamoxifen for 5 years.     Radiation Therapy   Radiation at [facility]   12/2019 - 12/2024 Anti-estrogen oral therapy   Anastrozole     INTERVAL HISTORY:  Ms. Leeb to review her survivorship care plan detailing her treatment course for breast cancer, as well as monitoring long-term side effects of that treatment, education regarding health maintenance, screening, and overall wellness and health promotion.     Overall, Ms. Snead reports feeling quite well.  She was seen last week for a linear breast rash and was started on Valtrex and Gabapentin due to this.  She notes that the areas on her breasts didn't open up.  She now has a rash that has spread throughout her body.  Her breast has not improved.  She was having electric pain in her breast.  When her rash spread she saw her PCP who discontinued her Valtrex, and then discontinued her Gabapentin because Kenley was fearful to start it because of its listed side effects.  The only medication that was new was the Anastrozole.  She says that her sister had an experience similar to this with anti estrogen therapies. She has not had any fever or chills.  She notes that her face will get flushed, and her lips felt burnt, and the palms of her hands felt burnt.  She did start on a steroid taper prescribed by her PCP.    REVIEW OF SYSTEMS:  Review of Systems  Constitutional: Negative for appetite change and chills.  HENT:   Negative for hearing loss, lump/mass, mouth sores and sore throat.   Eyes: Negative for eye problems and icterus.  Respiratory: Negative  for chest tightness, cough and shortness of breath.   Cardiovascular: Negative for chest pain, leg swelling and palpitations.  Gastrointestinal: Negative for abdominal distention, abdominal pain, constipation, diarrhea, nausea and vomiting.  Skin: Positive for itching and rash.  Neurological: Negative for dizziness, extremity weakness and numbness.   Hematological: Negative for adenopathy. Does not bruise/bleed easily.  Psychiatric/Behavioral: Negative for depression. The patient is nervous/anxious.     ONCOLOGY TREATMENT TEAM:  1. Surgeon:  Dr. [name] at [facility] 2. Medical Oncologist: Dr. Lindi Adie  3. Radiation Oncologist: Dr. Sondra Come    PAST MEDICAL/SURGICAL HISTORY:  Past Medical History:  Diagnosis Date  . Family history of breast cancer   . Family history of lung cancer   . Family history of prostate cancer   . Hypertension    Past Surgical History:  Procedure Laterality Date  . BREAST LUMPECTOMY WITH RADIOACTIVE SEED AND SENTINEL LYMPH NODE BIOPSY Right 09/18/2019   Procedure: RIGHT BREAST LUMPECTOMY WITH RADIOACTIVE SEED AND RIGHT AXILLARY SENTINEL LYMPH NODE BIOPSY;  Surgeon: Alphonsa Overall, MD;  Location: [facility];  Service: General;  Laterality: Right;  . PARTIAL HYSTERECTOMY    . TONSILLECTOMY AND ADENOIDECTOMY       ALLERGIES:  Allergies  Allergen Reactions  . Ace Inhibitors Rash  . Penicillins Rash    Did it involve swelling of the face/tongue/throat, SOB, or low BP? Unknown Did it involve sudden or severe rash/hives, skin peeling, or any reaction on the inside of your mouth or nose? Unknown Did you need to seek medical attention at a hospital or doctor's office? Unknown When did it last happen?Childhood If all above answers are "NO", may proceed with cephalosporin use.  . Sulfa Antibiotics Rash     CURRENT MEDICATIONS:  Outpatient Encounter Medications as of 03/25/2020  Medication Sig  . anastrozole (ARIMIDEX) 1 MG tablet Take 1 tablet (1 mg total) by mouth daily.  . Calcium Carb-Cholecalciferol (CALCIUM 600+D3 PO) Take by mouth.  . gabapentin (NEURONTIN) 100 MG capsule Take 1 capsule (100 mg total) by mouth at bedtime. Take 1-3 tablets QHS  . levothyroxine (SYNTHROID) 75 MCG tablet Take 75 mcg by mouth daily before breakfast.  . losartan (COZAAR) 100 MG tablet Take  100 mg by mouth daily.  Marland Kitchen triamcinolone ointment (KENALOG) 0.5 % Apply 1 application topically 2 (two) times daily.  . valACYclovir (VALTREX) 1000 MG tablet Take 1 tablet (1,000 mg total) by mouth 3 (three) times daily.   No facility-administered encounter medications on file as of 03/25/2020.     ONCOLOGIC FAMILY HISTORY:  Family History  Problem Relation Age of Onset  . Breast cancer Father 93       normal genetic testing in 2011  . Breast cancer Sister 102       second diagnosis in other breast at age 71  . Breast cancer Paternal Aunt 78  . Lung cancer Paternal Uncle   . Other Brother        brain tumor in his 17s  . Lung cancer Maternal Uncle        smoker  . Aneurysm Maternal Grandmother   . Prostate cancer Paternal Uncle   . Cancer Paternal Uncle        unknown type     GENETIC COUNSELING/TESTING: See above  SOCIAL HISTORY:  Social History   Socioeconomic History  . Marital status: Married    Spouse name: Not on file  . Number of children: Not on file  . Years of education:  Not on file  . Highest education level: Not on file  Occupational History  . Not on file  Tobacco Use  . Smoking status: Never Smoker  . Smokeless tobacco: Never Used  Substance and Sexual Activity  . Alcohol use: Not Currently  . Drug use: Never  . Sexual activity: Not on file  Other Topics Concern  . Not on file  Social History Narrative  . Not on file   Social Determinants of Health   Financial Resource Strain:   . Difficulty of Paying Living Expenses:   Food Insecurity:   . Worried About Charity fundraiser in the Last Year:   . Arboriculturist in the Last Year:   Transportation Needs:   . Film/video editor (Medical):   Marland Kitchen Lack of Transportation (Non-Medical):   Physical Activity:   . Days of Exercise per Week:   . Minutes of Exercise per Session:   Stress:   . Feeling of Stress :   Social Connections:   . Frequency of Communication with Friends and Family:   .  Frequency of Social Gatherings with Friends and Family:   . Attends Religious Services:   . Active Member of Clubs or Organizations:   . Attends Archivist Meetings:   Marland Kitchen Marital Status:   Intimate Partner Violence:   . Fear of Current or Ex-Partner:   . Emotionally Abused:   Marland Kitchen Physically Abused:   . Sexually Abused:      OBSERVATIONS/OBJECTIVE:  Patient appears well.  In no apparent distress, mood normal.  Breathing is non labored.  Skin visualized without rash or lesion.    LABORATORY DATA:  None for this visit.  DIAGNOSTIC IMAGING:  None for this visit.      ASSESSMENT AND PLAN:  Ms.. Matt is a pleasant 69 y.o. female with Stage IA right breast invasive ductal carcinoma, ER+/PR+/HER2-, diagnosed in 08/2019, treated with lumpectomy, adjuvant radiation therapy, and anti-estrogen therapy with Anastrozole beginning in 12/2019.  She presents to the Survivorship Clinic for our initial meeting and routine follow-up post-completion of treatment for breast cancer.    1. Stage IA right breast cancer:  Ms. Temkin is continuing to recover from definitive treatment for breast cancer. She will follow-up with her medical oncologist, Dr. Lindi Adie in 8 weeks months with history and physical exam per surveillance protocol.  Her mammogram is due 09/2020; orders placed today. Today, a comprehensive survivorship care plan and treatment summary was reviewed with the patient today detailing her breast cancer diagnosis, treatment course, potential late/long-term effects of treatment, appropriate follow-up care with recommendations for the future, and patient education resources.  A copy of this summary, along with a letter will be sent to the patient's primary care provider via mail/fax/In Basket message after today's visit.    2. Rash: She was taken off of gabapentin and Valtrex by her PCP.  She was given steroids, and the rash has persisted.  I recommended she stop the Anastrozole for 2-4 weeks to  evaluate whether or not the rash will improve.  With her being virtual and not in person, I cannot really see or determine any of the rash issue, as it isn't evident to me on the video.  However, she has no signs of an infection, so if she develops increasing swelling, spreading erythema, increased tenderness and/or fevers/chills, we can start antibiotics in that case.  I also recommended she go ahead and get in with dermatology to see if they can help  determine the etiology of the rash.    3.  Weight concerns: She is exercising 3-4 days per week and eats healthy.  She is still gaining weight and is very frustrated without losing weight.  We talked about diet and exercise, and I am happy to refer her to healthy weight and wellness if needed.    4. Bone health:  Given Ms. Leider's age/history of breast cancer and her current treatment regimen including anti-estrogen therapy with Anastrozole, she is at risk for bone demineralization.  Her last DEXA scan was 06/2019 and showed mild osteopenia per her report.  She has this done with her PCP.  She was given education on specific activities to promote bone health.  5. Cancer screening:  Due to Ms. Heinrich's history and her age, she should receive screening for skin cancers, colon cancer, and gynecologic cancers.  The information and recommendations are listed on the patient's comprehensive care plan/treatment summary and were reviewed in detail with the patient.    6. Health maintenance and wellness promotion: Ms. Alameda was encouraged to consume 5-7 servings of fruits and vegetables per day. We reviewed the "Nutrition Rainbow" handout, as well as the handout "Take Control of Your Health and Reduce Your Cancer Risk" from the [place].  She was also encouraged to engage in moderate to vigorous exercise for 30 minutes per day most days of the week. We discussed the [employer] fitness program, which is designed for cancer survivors to help them become  more physically fit after cancer treatments.  She was instructed to limit her alcohol consumption and continue to abstain from tobacco use.     7. Support services/counseling: It is not uncommon for this period of the patient's cancer care trajectory to be one of many emotions and stressors.  We discussed how this can be increasingly difficult during the times of quarantine and social distancing due to the COVID-19 pandemic.   She was given information regarding our available services and encouraged to contact me with any questions or for help enrolling in any of our support group/programs.   8.  Positive distress screen: Completed QOL CSV   Follow up instructions:    -Return to cancer center in 8 weeks for f/u with Dr. Lindi Adie -Mammogram due in 09/2020 -She is welcome to return back to the Survivorship Clinic at any time; no additional follow-up needed at this time.  -Consider referral back to survivorship as a long-term survivor for continued surveillance  The patient was provided an opportunity to ask questions and all were answered. The patient agreed with the plan and demonstrated an understanding of the instructions.   Total encounter time: 45 minutes*  Wilber Bihari, NP 03/25/20 3:19 PM Medical Oncology and Hematology [facility] [place], [place] [address] Tel. [phone]    Fax. [phone]   *Total Encounter Time as defined by the Centers for Medicare and Medicaid Services includes, in addition to the face-to-face time of a patient visit (documented in the note above) non-face-to-face time: obtaining and reviewing outside history, ordering and reviewing medications, tests or procedures, care coordination (communications with other health care professionals or caregivers) and documentation in the medical record.

## 2020-03-26 ENCOUNTER — Encounter: Payer: Self-pay | Admitting: Adult Health

## 2020-03-27 ENCOUNTER — Telehealth: Payer: Self-pay

## 2020-03-27 NOTE — Telephone Encounter (Signed)
Faxed orders for Mammogram to [facility] at [phone]. Received confirmation.

## 2020-03-30 ENCOUNTER — Encounter: Payer: Self-pay | Admitting: Licensed Clinical Social Worker

## 2020-03-30 NOTE — Progress Notes (Signed)
[facility] Quality of Life Screening Clinical Social Work  Clinical Social Work was referred by survivorship quality of life screening protocol.  The patient scored a 48.92 on the Quality of Life/ Cancer Survivor (QOL-CS) scale which indicates high quality of life.   Clinical Social Worker contacted patient by phone to assess for needs. Based on screener and patient report, anxiety and mood was a concern. However, since stopping the Anastrozole (per NP's instruction), she is feeling much more like her previous self with fewer mood swings and less irritability. She would like to get back to exercising more but is waiting to figure out what her rash is and how to resolve it as it becomes extremely uncomfortable when she is hot. Otherwise, she is coping well and is keeping busy with many life events and vacation in the next 2 weeks.     Follow up plan: 1. Patient to reach out if mood concerns recur 2. Follow-up QOL screen to be sent in 1 months.     Jasmine Sosa E, LCSW

## 2020-04-08 ENCOUNTER — Encounter (INDEPENDENT_AMBULATORY_CARE_PROVIDER_SITE_OTHER): Payer: Self-pay

## 2020-04-16 ENCOUNTER — Other Ambulatory Visit: Payer: Self-pay | Admitting: Adult Health

## 2020-04-16 DIAGNOSIS — B029 Zoster without complications: Secondary | ICD-10-CM

## 2020-05-04 ENCOUNTER — Other Ambulatory Visit: Payer: Self-pay

## 2020-05-04 ENCOUNTER — Other Ambulatory Visit: Payer: Self-pay | Admitting: Adult Health

## 2020-05-04 ENCOUNTER — Telehealth: Payer: Self-pay

## 2020-05-04 MED ORDER — LETROZOLE 2.5 MG PO TABS: 2.5000 mg | ORAL_TABLET | Freq: Every day | ORAL | 0 refills | Status: DC

## 2020-05-04 NOTE — Telephone Encounter (Signed)
Pt states she is willing to try Letrozole but requests only a 30 day supply at first to know she can tolerate it. Prefers it to be sent to [facility] in [place].

## 2020-05-04 NOTE — Telephone Encounter (Signed)
Pt called and states she has stopped the anastrazole d/t allergic reaction, as she went to a dermatologist and had a skin bx which revealed the break out was not shingles, but medication related. Pt states only new medication she was taking was Anastrazole, as she never took the gabapentin after it was filled d/t side effect warnings. Pt would like to start a new anti-hormone drug, but voices concern about another reaction, as her sister who also has BRCA was not able to take any anti-hormone drugs d/t allergic reactions. Please advise.

## 2020-05-04 NOTE — Progress Notes (Signed)
Spoke with pt who agreed to try Letrozole. Per Wilber Bihari, NP, pt has been prescribed Letrozole 2.5 mg PO QD. Pt wishes to only have a 30 day supply in case she were to have a reaction.

## 2020-05-04 NOTE — Telephone Encounter (Signed)
Ok.  Please send in 30 day supply of Letrozole 2.5 mg daily.  Thank you.

## 2020-05-04 NOTE — Telephone Encounter (Signed)
We can try Letrozole daily if she is willing to take.

## 2020-05-06 ENCOUNTER — Encounter (INDEPENDENT_AMBULATORY_CARE_PROVIDER_SITE_OTHER): Payer: Self-pay

## 2020-05-06 ENCOUNTER — Encounter: Payer: Self-pay | Admitting: Adult Health

## 2020-05-13 ENCOUNTER — Encounter: Payer: Self-pay | Admitting: Adult Health

## 2020-05-25 ENCOUNTER — Other Ambulatory Visit: Payer: Self-pay

## 2020-05-25 MED ORDER — LETROZOLE 2.5 MG PO TABS: ORAL_TABLET | ORAL | 0 refills | Status: DC

## 2020-05-25 NOTE — Telephone Encounter (Signed)
Pt called requesting refill for Letrozole 2.5 mg.   This nurse returned pt's call to check and see how she is tolerating medication as she was not able to tolerate anastrozole. Pt states she is tolerating it well, had a h/a the first week taking it, then some nausea but both have since subsided and she is hopeful that this one will work for her. Will continue to follow pt's progress on tolerating Letrozole. Refill has been sent.

## 2020-08-17 ENCOUNTER — Other Ambulatory Visit: Payer: Self-pay | Admitting: Adult Health

## 2020-08-18 MED ORDER — LETROZOLE 2.5 MG PO TABS: ORAL_TABLET | ORAL | 0 refills | Status: DC

## 2020-11-11 ENCOUNTER — Other Ambulatory Visit: Payer: Self-pay | Admitting: Adult Health

## 2020-11-11 MED ORDER — LETROZOLE 2.5 MG PO TABS: ORAL_TABLET | ORAL | 0 refills | Status: DC

## 2021-02-12 ENCOUNTER — Telehealth: Payer: Self-pay | Admitting: Hematology and Oncology

## 2021-02-12 ENCOUNTER — Other Ambulatory Visit: Payer: Self-pay | Admitting: Oncology

## 2021-02-12 NOTE — Telephone Encounter (Signed)
Called pt to schedule appt per 4/29 sch msg. Pt declined to sch at this time. She stated she would call back when she was ready to schedule.

## 2021-03-22 NOTE — Progress Notes (Signed)
Patient Care Team: [name], NP as PCP - General (Internal Medicine) Gery Pray, MD as Consulting Physician (Radiation Oncology) Alphonsa Overall, MD as Consulting Physician (General Surgery) Nicholas Lose, MD as Consulting Physician (Hematology and Oncology)  DIAGNOSIS:    ICD-10-CM   1. Malignant neoplasm of upper-outer quadrant of right breast in female, estrogen receptor positive ([place])  C50.411    Z17.0     SUMMARY OF ONCOLOGIC HISTORY: Oncology History  Malignant neoplasm of upper-outer quadrant of right breast in female, estrogen receptor positive ([place])  08/23/2019 Initial Diagnosis   Routine screening mammogram detected 0.8cm mass in the 8:45 position of the right breast, no abnormal right axillary lymph nodes. 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    09/03/2019 Genetic Testing   Negative genetic testing:  No pathogenic variants identified on the [facility] Breast Cancer STAT panel or the Common Hereditary Cancers panel. The report date is 09/03/2019.  The Breast Cancer STAT panel offered by [facility] includes sequencing and rearrangement analysis for the following 9 genes:  ATM, BRCA1, BRCA2, CDH1, CHEK2, PALB2, PTEN, STK11 and TP53.  The Common Hereditary Cancers Panel offered by [facility] includes sequencing and/or deletion duplication testing of the following 48 genes: APC, ATM, AXIN2, BARD1, BMPR1A, BRCA1, BRCA2, BRIP1, CDH1, CDK4, CDKN2A (p14ARF), CDKN2A (p16INK4a), CHEK2, CTNNA1, DICER1, EPCAM (Deletion/duplication testing only), GREM1 (promoter region deletion/duplication testing only), KIT, MEN1, MLH1, MSH2, MSH3, MSH6, MUTYH, NBN, NF1, NHTL1, PALB2, PDGFRA, PMS2, POLD1, POLE, PTEN, RAD50, RAD51C, RAD51D, RNF43, SDHB, SDHC, SDHD, SMAD4, SMARCA4. STK11, TP53, TSC1, TSC2, and VHL.  The following genes were evaluated for sequence changes only: SDHA and HOXB13 c.251G>A variant only.   09/18/2019 Surgery   Right  lumpectomy [name]) [phone]): IDC, grade 1, 1.2cm, with low grade DCIS, clear margins, 2 right axillary lymph nodes negative.   09/18/2019 Cancer Staging   Staging form: Breast, AJCC 8th Edition - Pathologic stage from 09/18/2019: Stage IA (pT1c, pN0, cM0, G1, ER+, PR+, HER2-)   10/21/2019 Oncotype testing   The Oncotype DX score was 15 predicting a risk of outside the breast recurrence over the next 9 years of 4% if the patient's only systemic therapy is tamoxifen for 5 years.     Radiation Therapy   Radiation at [facility]   12/2019 - 12/2024 Anti-estrogen oral therapy   Anastrozole     CHIEF COMPLIANT: Follow-up of right breast cancer on anastrozole  INTERVAL HISTORY: Syncere Kaminski is a 70 y.o. with above-mentioned history of right breast cancer who underwent a right lumpectomy, radiation, and is currently on antiestrogen therapy with anastrozole. She presents to the clinic today for follow-up.  Patient related to me profound side effects related to radiation which finally healed.  She could not tolerate anastrozole which led to breakout throughout her body.  She was then switched to letrozole and she is tolerating letrozole much better.  Initially even with letrozole she had nausea for several months.  Now she does not have any side effects from it.  Her major concerns today are related to scar tissue tenderness.  She had a mammogram in November 2021 which was negative for breast cancer. Her other major complaint is related to increased abdominal girth.  She is working out 3 times a week and eating very healthy in spite of that she is gaining weight in her torso.  ALLERGIES:  is allergic to ace inhibitors, penicillins, and sulfa antibiotics.  MEDICATIONS:  Current Outpatient Medications  Medication Sig Dispense Refill  . Calcium Carb-Cholecalciferol (CALCIUM 600+D3 PO) Take by mouth.    . gabapentin (NEURONTIN) 100 MG capsule TAKE 1 TO 3 CAPSULES EVERY NIGHT AT BEDTIME 90  capsule 0  . letrozole (FEMARA) 2.5 MG tablet Once daily 90 tablet 3  . levothyroxine (SYNTHROID) 75 MCG tablet Take 75 mcg by mouth daily before breakfast.    . losartan (COZAAR) 100 MG tablet Take 100 mg by mouth daily.    Marland Kitchen triamcinolone ointment (KENALOG) 0.5 % Apply 1 application topically 2 (two) times daily. 60 g 0   No current facility-administered medications for this visit.    PHYSICAL EXAMINATION: ECOG PERFORMANCE STATUS: 1 - Symptomatic but completely ambulatory  Vitals:   03/23/21 1044  BP: (!) 143/78  Pulse: 81  Resp: 19  Temp: 97.6 F (36.4 C)  SpO2: 98%   Filed Weights   03/23/21 1044  Weight: 150 lb 3.2 oz (68.1 kg)    BREAST: No palpable masses or nodules in either right or left breasts. No palpable axillary supraclavicular or infraclavicular adenopathy no breast tenderness or nipple discharge. (exam performed in the presence of a chaperone)  LABORATORY DATA:  I have reviewed the data as listed CMP Latest Ref Rng & Units 08/28/2019  Glucose 70 - 99 mg/dL 91  BUN 8 - 23 mg/dL 16  Creatinine 0.44 - 1.00 mg/dL 0.79  Sodium 135 - 145 mmol/L 141  Potassium 3.5 - 5.1 mmol/L 3.8  Chloride 98 - 111 mmol/L 104  CO2 22 - 32 mmol/L 26  Calcium 8.9 - 10.3 mg/dL 9.7  Total Protein 6.5 - 8.1 g/dL 7.3  Total Bilirubin 0.3 - 1.2 mg/dL 0.4  Alkaline Phos 38 - 126 U/L 81  AST 15 - 41 U/L 16  ALT 0 - 44 U/L 13    Lab Results  Component Value Date   WBC 8.8 08/28/2019   HGB 13.3 08/28/2019   HCT 40.5 08/28/2019   MCV 91.2 08/28/2019   PLT 308 08/28/2019   NEUTROABS 4.6 08/28/2019    ASSESSMENT & PLAN:  Malignant neoplasm of upper-outer quadrant of right breast in female, estrogen receptor positive ([place]) 08/23/2019:Routine screening mammogram detected 0.8cm mass in the 8:45 position of the right breast, no abnormal right axillary lymph nodes. 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 T1b N0 stage Ia  clinical stage  09/18/19:Right lumpectomy [name]): IDC, grade 1, 1.2cm, with low grade DCIS, clear margins, 2 right axillary lymph nodes negative.HER-2 - (1+), ER+ 90%, PR+ 85%, Ki67 5%. T1cN0 Stage 1A Oncotype DX recurrence score 15: Low risk, 4% distant recurrence. Adjuvant radiation therapy  --------------------------------------------------------------------------------------------------------------- Current Treatment: Adjuvant antiestrogen therapy with anastrozole 1 mg daily started 12/17/19 (discontinued because of fixed drug eruption), switched to Letrozole  Toxicities: Tolerating letrozole extremely well Hot flashes  Breast cancer surveillance: 1.  Mammogram at [facility] 08/17/2020: Benign 2. breast exam 03/23/2021: Benign  RTC in 1 year for follow up    No orders of the defined types were placed in this encounter.  The patient has a good understanding of the overall plan. she agrees with it. she will call with any problems that may develop before the next visit here.  Total time spent: 20 mins including face to face time and time spent for planning, charting and coordination of care  Rulon Eisenmenger, MD, MPH 03/23/2021  I, Cloyde Reams Dorshimer, am acting as Education administrator for  Dr. Nicholas Lose.  I have reviewed the above documentation for accuracy and completeness, and I agree with the above.

## 2021-03-22 NOTE — Assessment & Plan Note (Signed)
08/23/2019:Routine screening mammogram detected 0.8cm mass in the 8:45 position of the right breast, no abnormal right axillary lymph nodes. 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 T1b N0 stage Ia clinical stage  09/18/19:Right lumpectomy [name]): IDC, grade 1, 1.2cm, with low grade DCIS, clear margins, 2 right axillary lymph nodes negative.HER-2 - (1+), ER+ 90%, PR+ 85%, Ki67 5%. T1cN0 Stage 1A Oncotype DX recurrence score 15: Low risk, 4% distant recurrence. Adjuvant radiation therapy  --------------------------------------------------------------------------------------------------------------- Current Treatment: Adjuvant antiestrogen therapy with anastrozole 1 mg daily started 12/17/19, switched to Letrozole  Toxicities:  RTC in 6 months for follow up

## 2021-03-23 ENCOUNTER — Ambulatory Visit: Payer: Medicare Other | Admitting: Hematology and Oncology

## 2021-03-23 ENCOUNTER — Other Ambulatory Visit: Payer: Self-pay

## 2021-03-23 ENCOUNTER — Inpatient Hospital Stay: Payer: Medicare Other | Attending: Hematology and Oncology | Admitting: Hematology and Oncology

## 2021-03-23 DIAGNOSIS — Z170 Estrogen receptor positive status [ER+]: Secondary | ICD-10-CM | POA: Diagnosis not present

## 2021-03-23 DIAGNOSIS — Z923 Personal history of irradiation: Secondary | ICD-10-CM | POA: Diagnosis not present

## 2021-03-23 DIAGNOSIS — C50411 Malignant neoplasm of upper-outer quadrant of right female breast: Secondary | ICD-10-CM | POA: Insufficient documentation

## 2021-03-23 DIAGNOSIS — Z79811 Long term (current) use of aromatase inhibitors: Secondary | ICD-10-CM | POA: Diagnosis not present

## 2021-03-23 DIAGNOSIS — Z79899 Other long term (current) drug therapy: Secondary | ICD-10-CM | POA: Diagnosis not present

## 2021-03-23 MED ORDER — LETROZOLE 2.5 MG PO TABS: ORAL_TABLET | ORAL | 3 refills | Status: DC

## 2021-04-02 ENCOUNTER — Telehealth: Payer: Self-pay | Admitting: Hematology and Oncology

## 2021-04-02 NOTE — Telephone Encounter (Signed)
Sch per 6/7 sch msg, pt aware.

## 2021-06-02 IMAGING — MG MM BREAST LOCALIZATION CLIP
4 series · 4 of 12 positions shown · non-contrast
Comparison: Previous exam(s).

CLINICAL DATA: Ultrasound-guided core needle biopsy was performed
of a suspicious mass in the lower outer quadrant of the right
breast.

EXAM:
DIAGNOSTIC RIGHT MAMMOGRAM POST ULTRASOUND BIOPSY

[R CC synth-2D]
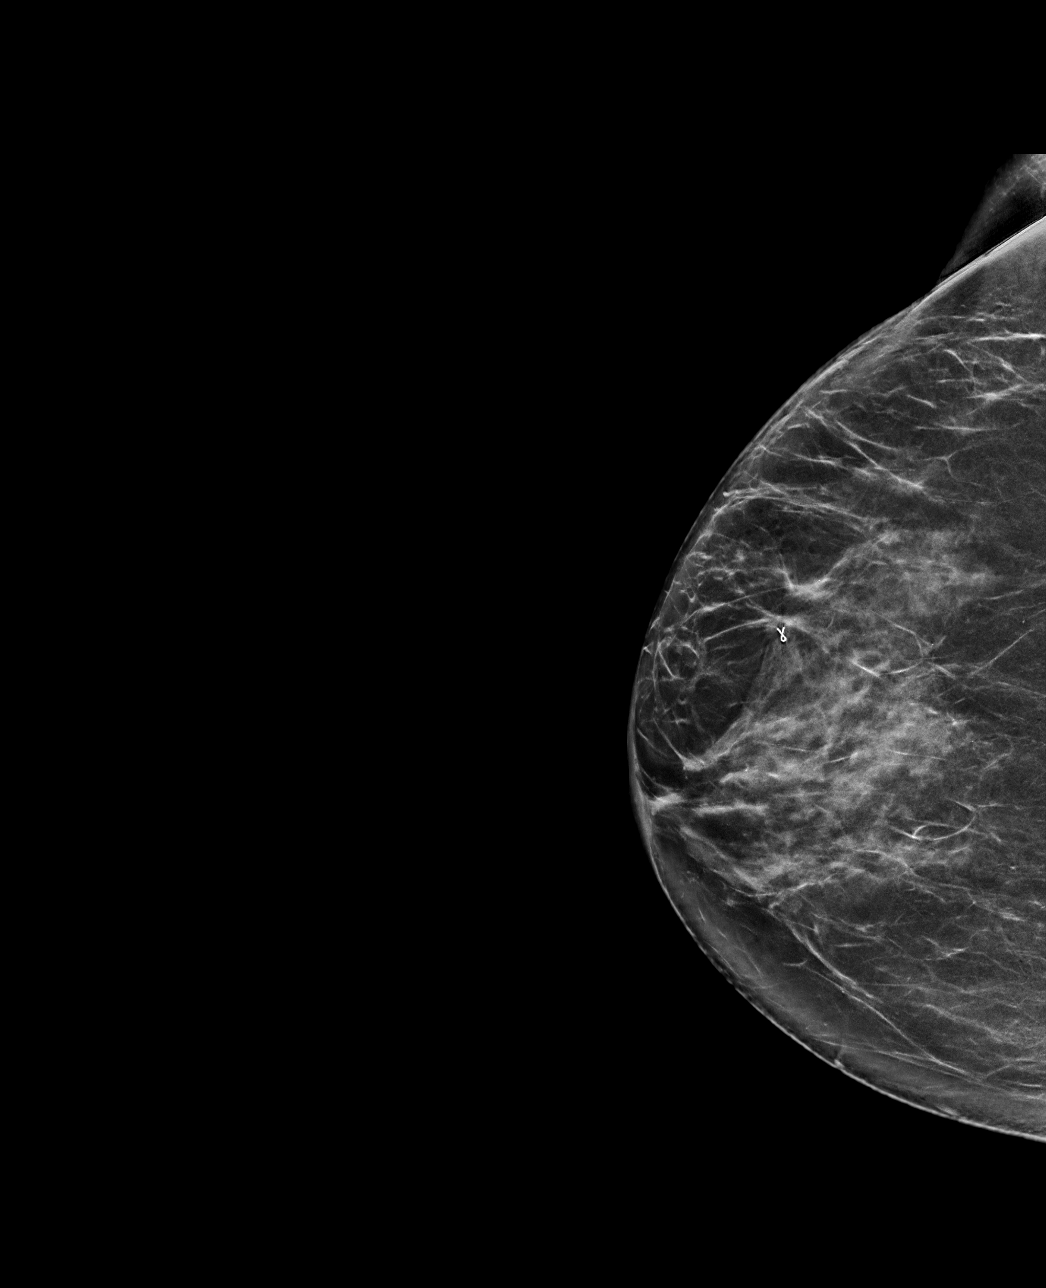

[R ML synth-2D]
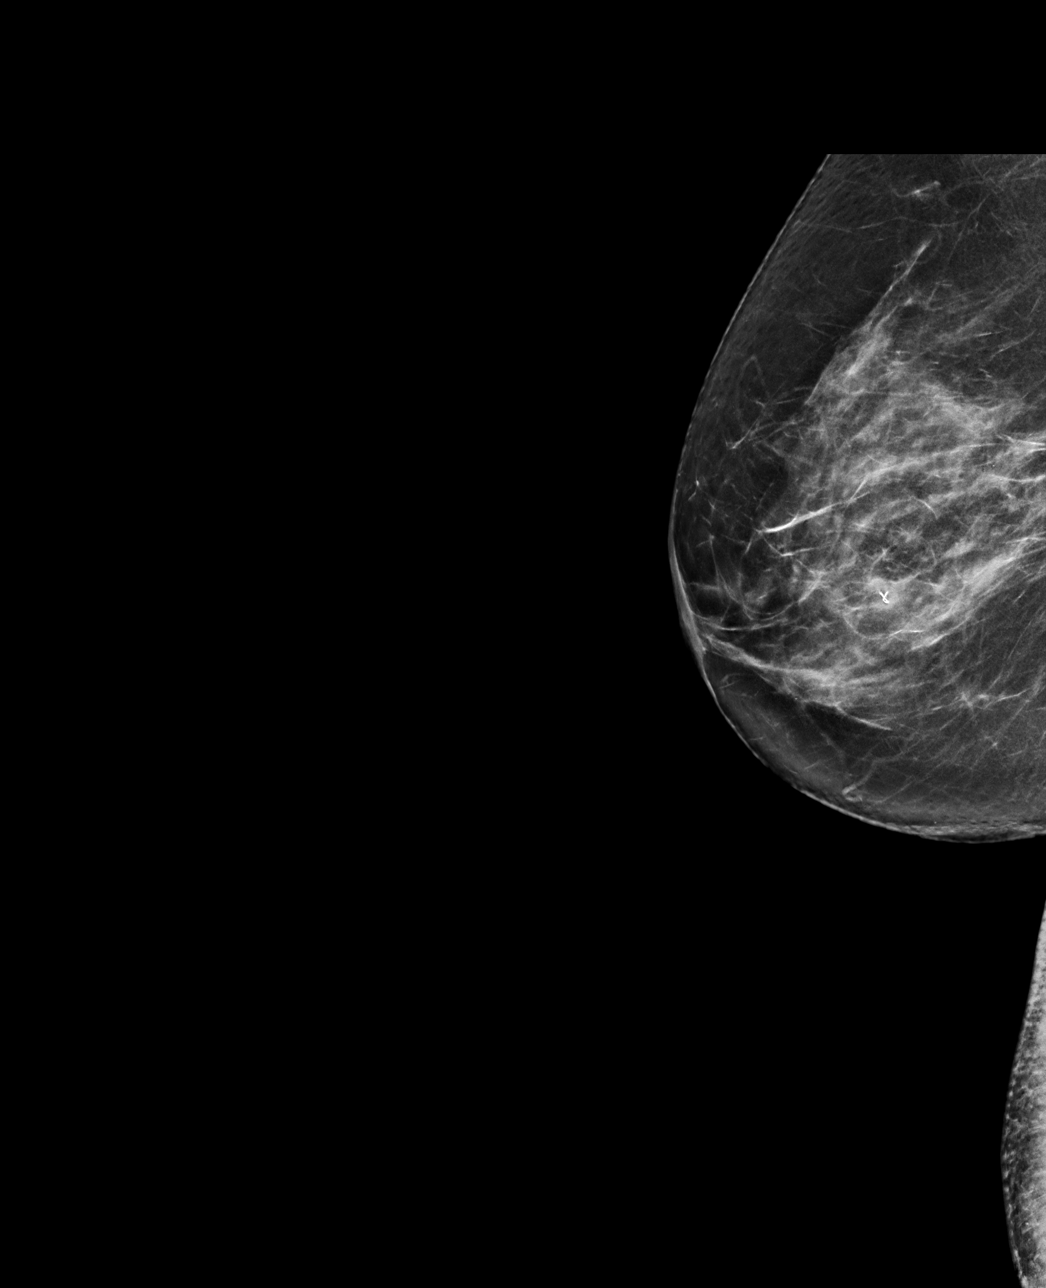

[R ML tomo · tomo slice 38/75.0]
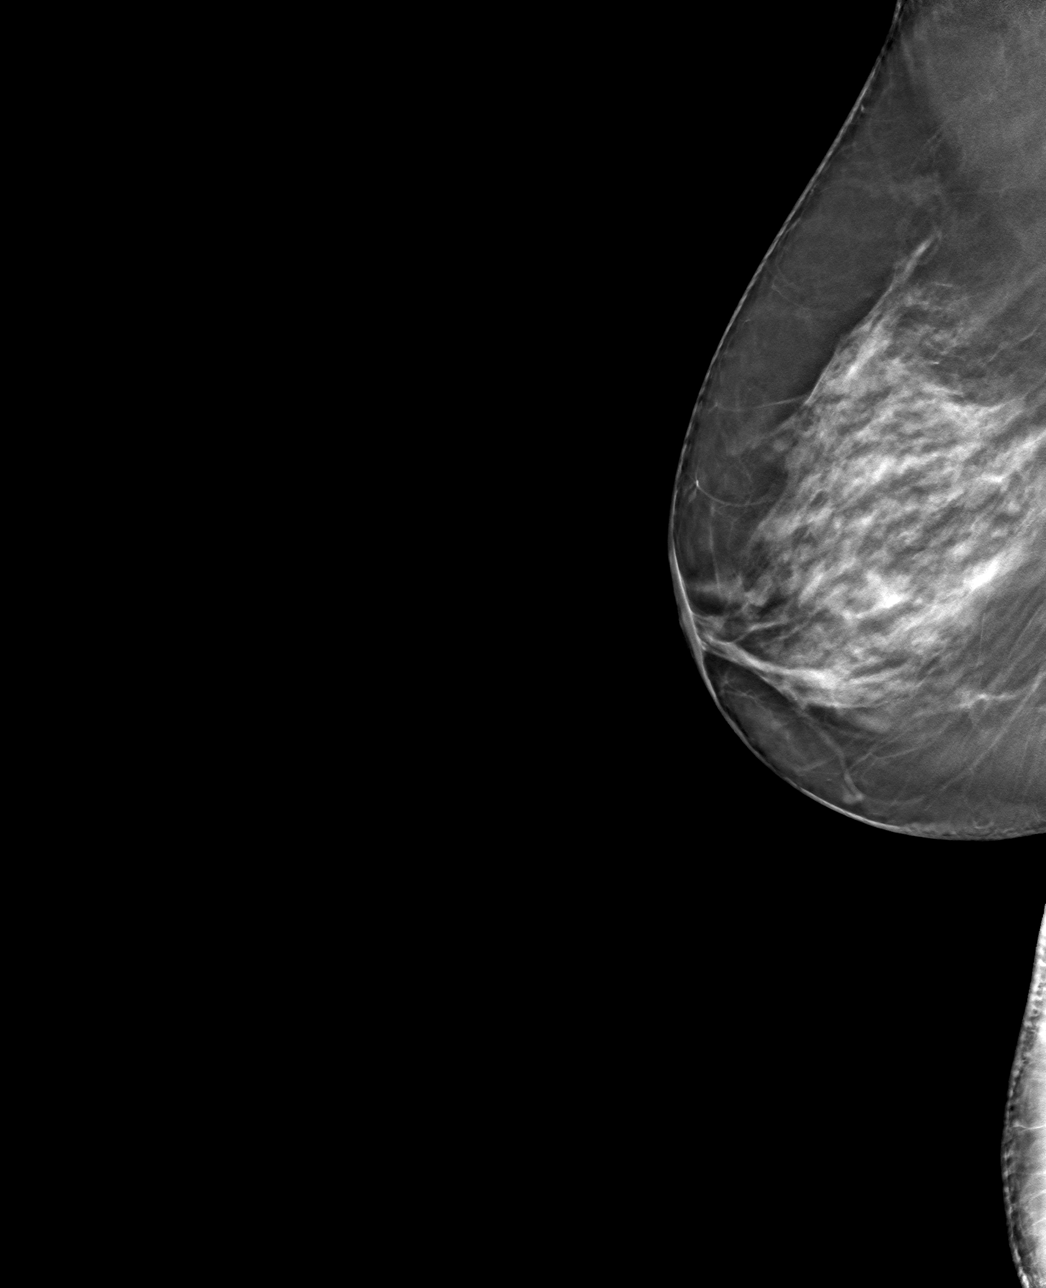

[R CC tomo · tomo slice 37/72.0]
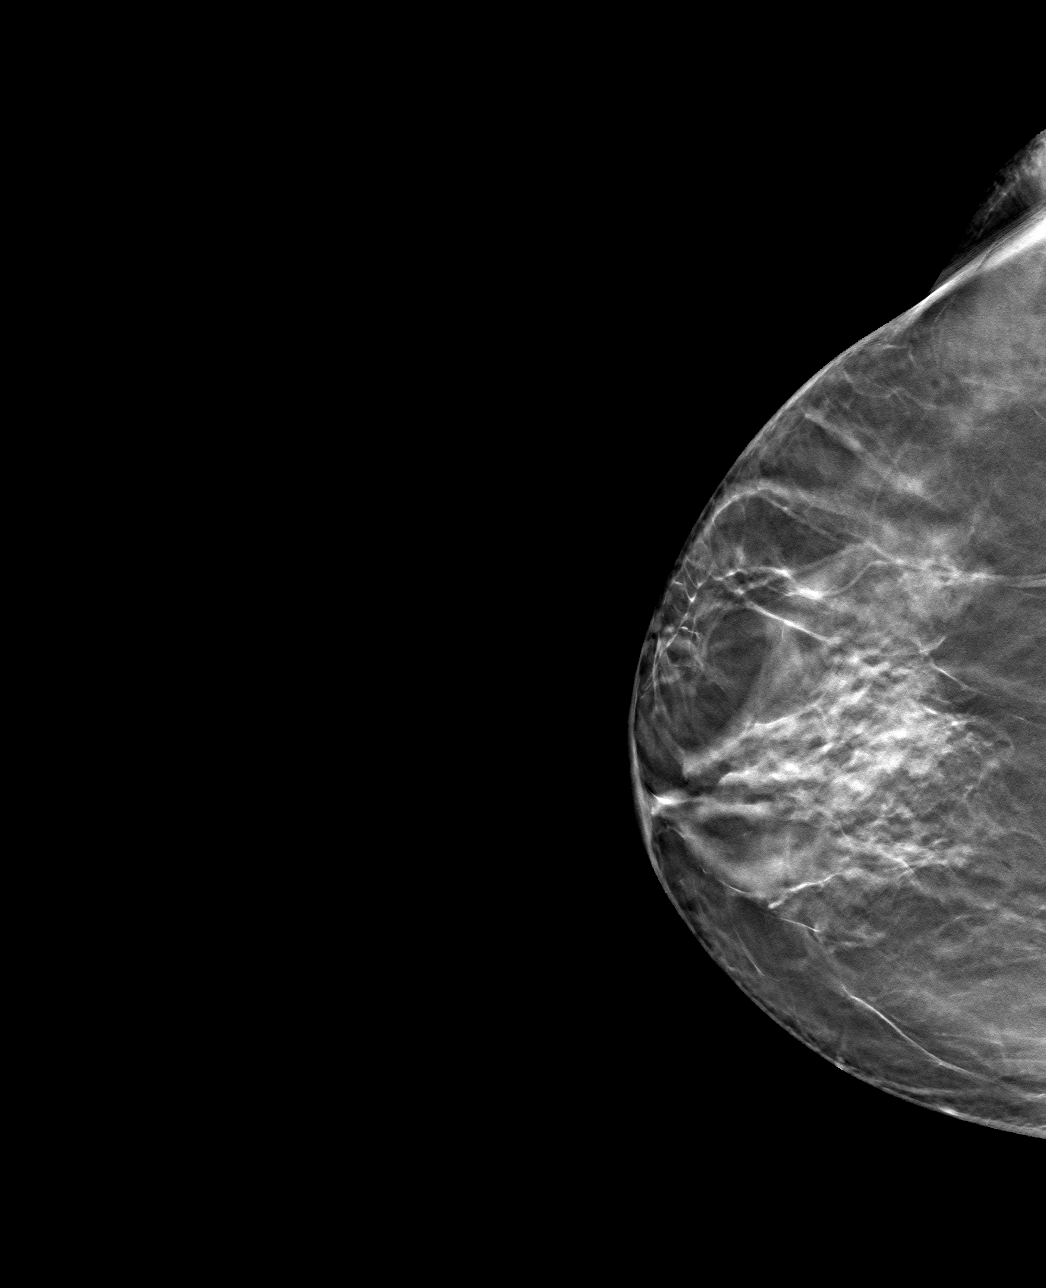

[4 of 12 positions shown; findings below may reference images not displayed]

FINDINGS: Mammographic images were obtained following ultrasound guided biopsy
of a right breast mass. The biopsy marking clip is in expected
position at the site of biopsy.
IMPRESSION: Appropriate positioning of the ribbon shaped biopsy marking clip at
the site of biopsy in the [DATE] position of the right breast.

Final Assessment: Post Procedure Mammograms for Marker Placement

## 2021-06-02 IMAGING — US US  BREAST BX W/ LOC DEV 1ST LESION IMG BX SPEC US GUIDE*R*
1 series · 12 of 14 positions shown · non-contrast
Comparison: Previous exam(s).
COMPARISON: Previous exam(s).

Addendum:
CLINICAL DATA: Ultrasound-guided core needle biopsy was recommended
of a mass in the [DATE] position of the right breast 5 cm from the
nipple.

EXAM:
ULTRASOUND GUIDED RIGHT BREAST CORE NEEDLE BIOPSY

[Series 1: us breast bx w/ loc dev 1st lesion img bx spec us  · 0.07mm/px · 12 of 14 slices shown]
[im 1/14]
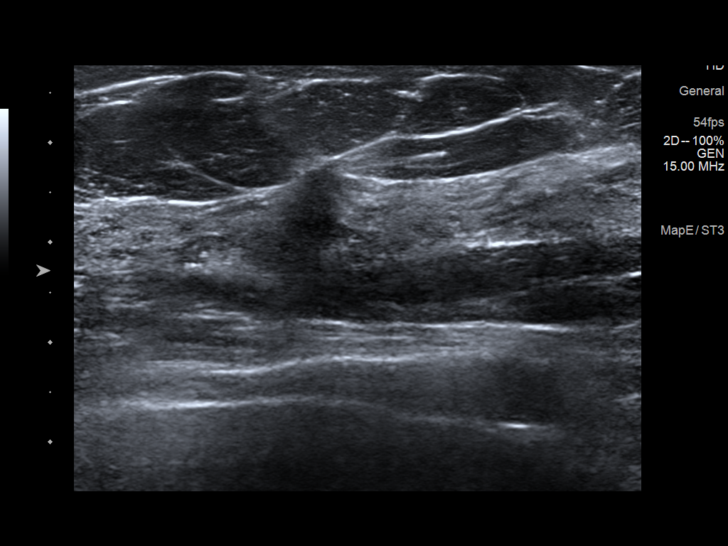
[im 2/14]
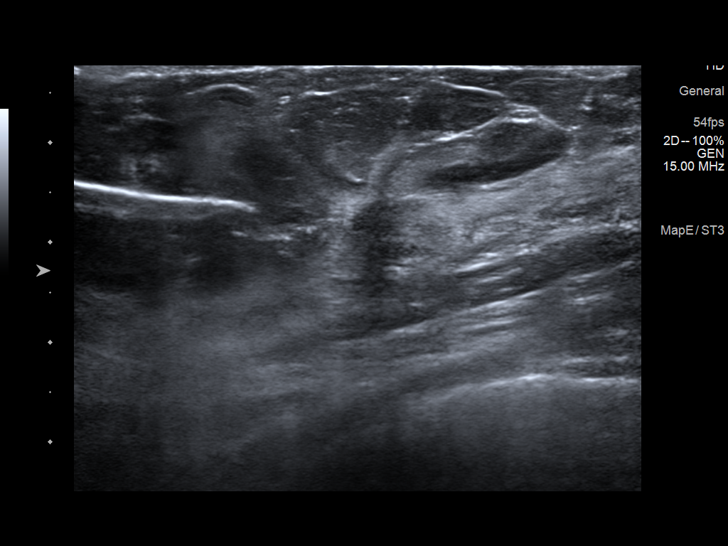
[im 3/14]
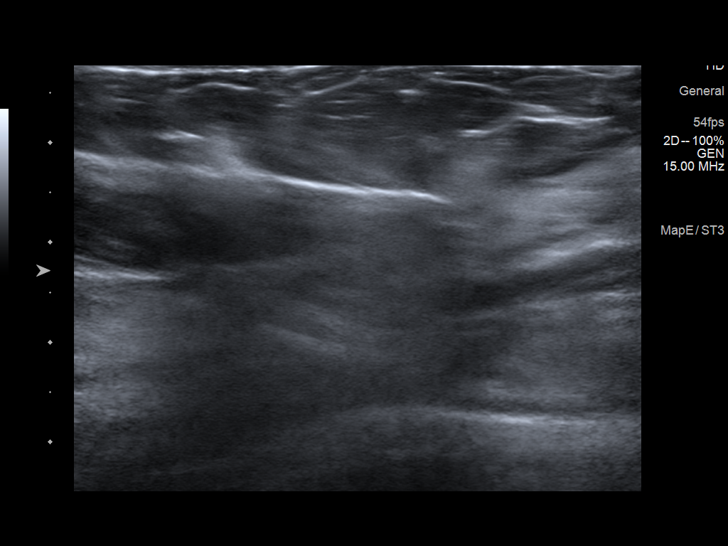
[im 5/14]
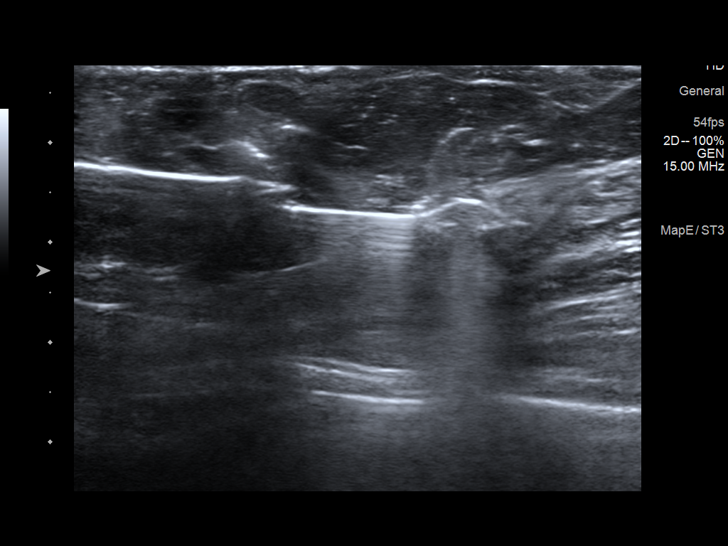
[im 6/14]
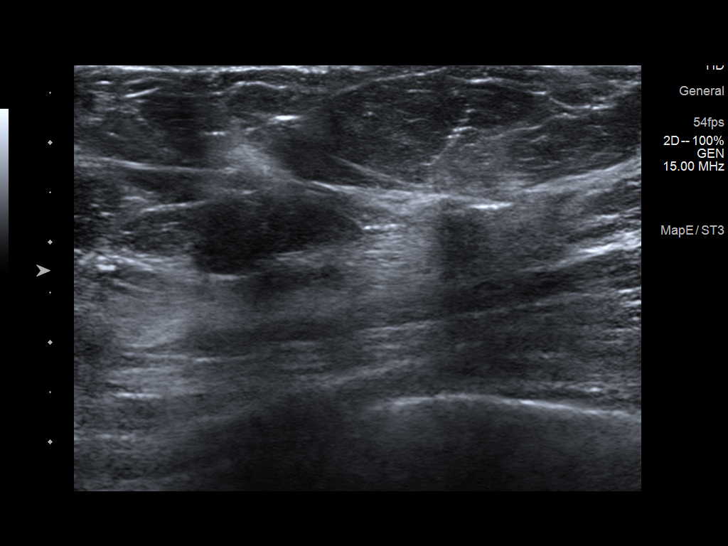
[im 7/14]
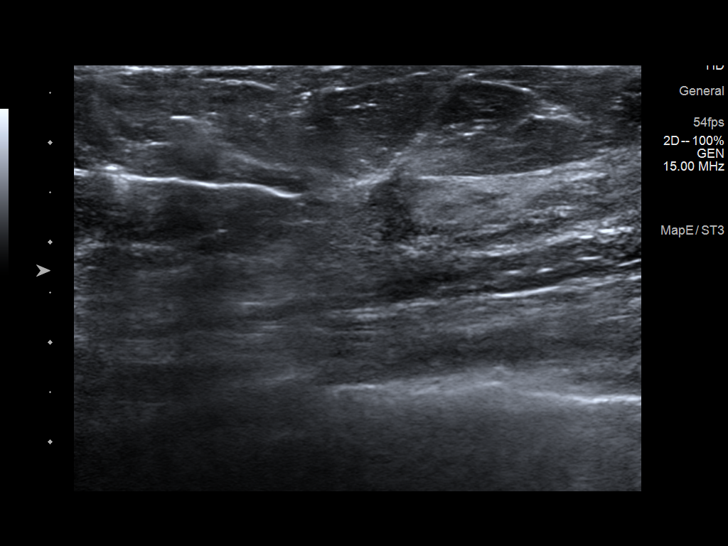
[im 8/14]
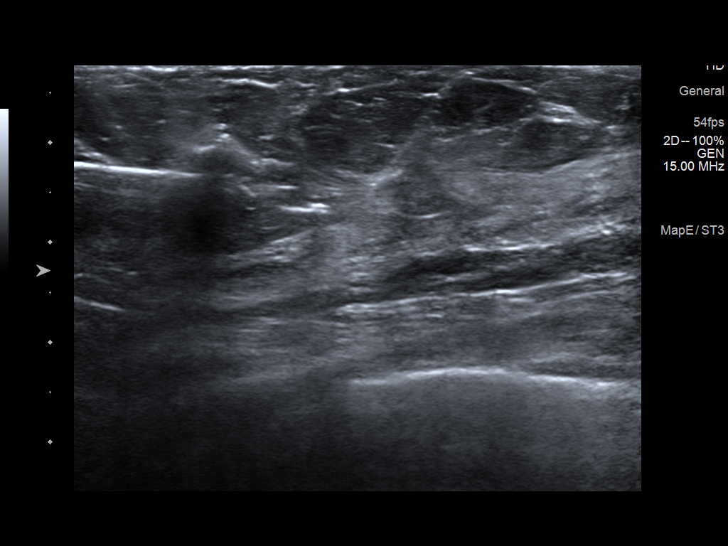
[im 9/14]
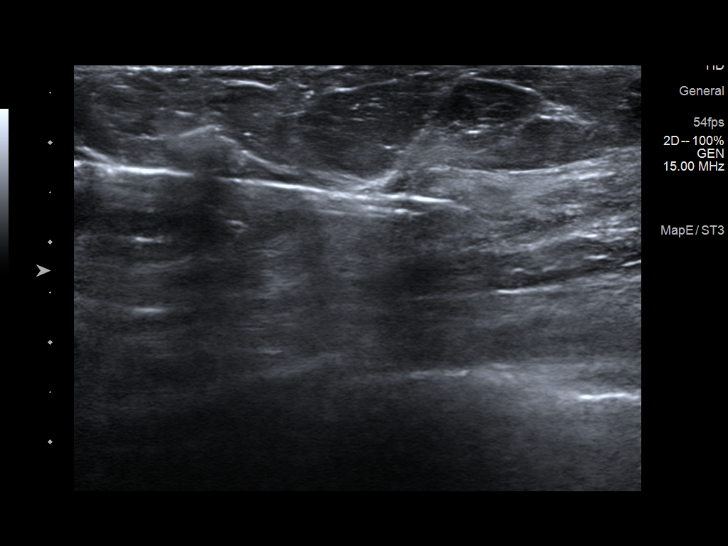
[im 10/14]
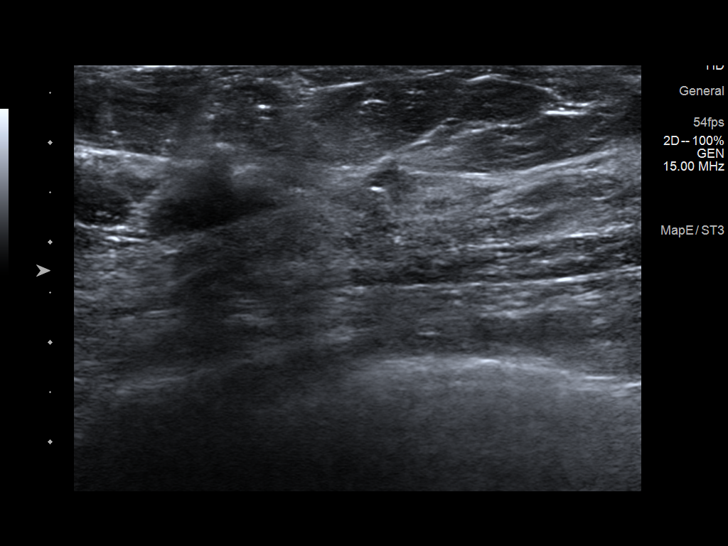
[im 12/14]
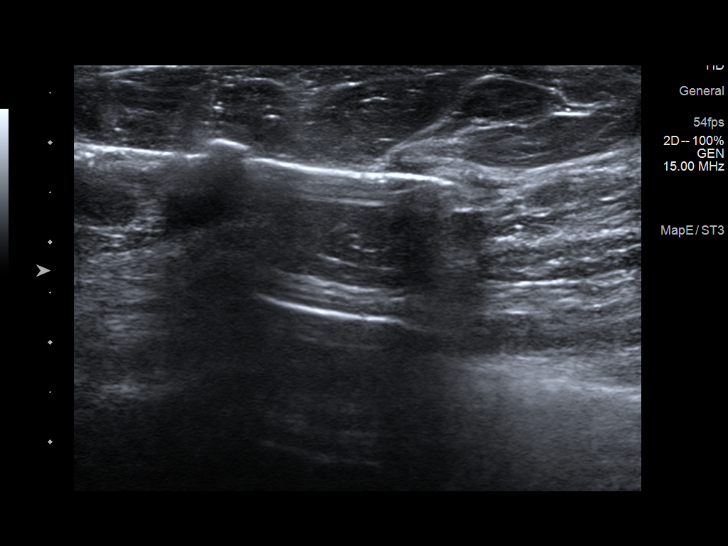
[im 13/14]
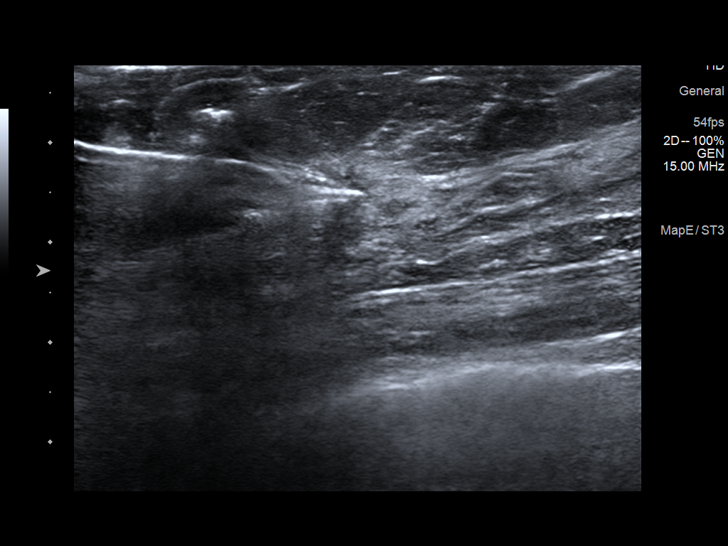
[im 14/14]
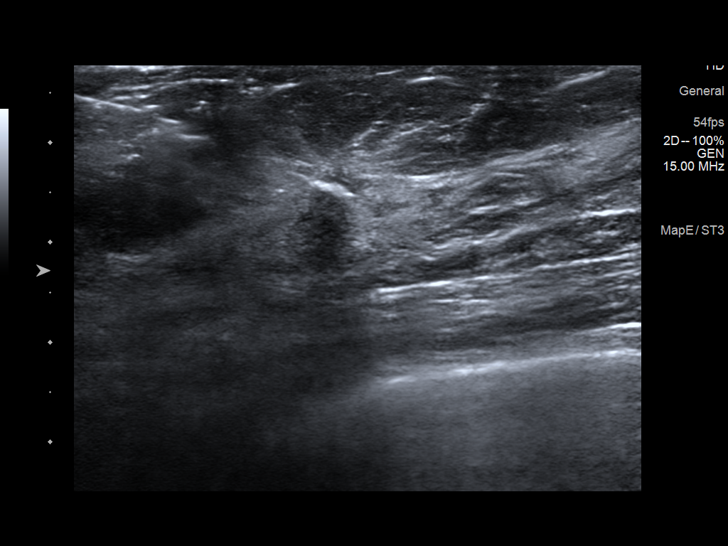

[12 of 14 positions shown; findings below may reference images not displayed]



Lesion quadrant: Lower outer quadrant

Using sterile technique and 1% Lidocaine as local anesthetic, under
direct ultrasound visualization, a 12 gauge Ganas device was
used to perform biopsy of an irregular hypoechoic mass at [DATE]
position 5 cm from the nipple using a lateral approach. At the
conclusion of the procedure ribbon tissue marker clip was deployed
into the biopsy cavity. Follow up 2 view mammogram was performed and
dictated separately.
IMPRESSION: Ultrasound guided biopsy of the right breast. No apparent
complications.

ADDENDUM:
Pathology revealed GRADE I INVASIVE AND IN SITU MAMMARY CARCINOMA of
the Right breast, 8:45 o'clock position, 5 cmfn. This was found to
be concordant by Dr. Lorenz Jumper.

Pathology results were discussed with the patient by telephone by
per request.

I called the patient and she reported doing well after the biopsy
with tenderness at the site. Post biopsy instructions and care were
reviewed and questions were answered. The patient was encouraged to
call The [REDACTED] for any additional
concerns.

The patient was referred to [REDACTED]
[REDACTED] at [REDACTED] on
August 28, 2019.

Pathology results reported by Dilko Lene, RN on 08/23/2019.



Lesion quadrant: Lower outer quadrant

Using sterile technique and 1% Lidocaine as local anesthetic, under
direct ultrasound visualization, a 12 gauge Ganas device was
used to perform biopsy of an irregular hypoechoic mass at [DATE]
position 5 cm from the nipple using a lateral approach. At the
conclusion of the procedure ribbon tissue marker clip was deployed
into the biopsy cavity. Follow up 2 view mammogram was performed and
dictated separately.
IMPRESSION: Ultrasound guided biopsy of the right breast. No apparent
complications.

## 2022-02-28 ENCOUNTER — Telehealth: Payer: Self-pay | Admitting: Hematology and Oncology

## 2022-02-28 NOTE — Telephone Encounter (Signed)
Rescheduled appointment per provider PAL. Patient is aware of the changes made to her upcoming appointment. 

## 2022-03-04 ENCOUNTER — Other Ambulatory Visit: Payer: Self-pay

## 2022-03-04 MED ORDER — LETROZOLE 2.5 MG PO TABS
ORAL_TABLET | ORAL | 3 refills | Status: AC
Start: 2022-03-04 — End: ?

## 2022-03-17 ENCOUNTER — Inpatient Hospital Stay: Payer: Medicare Other | Attending: Hematology and Oncology | Admitting: Hematology and Oncology

## 2022-03-17 ENCOUNTER — Telehealth: Payer: Self-pay | Admitting: Hematology and Oncology

## 2022-03-17 DIAGNOSIS — Z79811 Long term (current) use of aromatase inhibitors: Secondary | ICD-10-CM | POA: Insufficient documentation

## 2022-03-17 DIAGNOSIS — C50411 Malignant neoplasm of upper-outer quadrant of right female breast: Secondary | ICD-10-CM | POA: Diagnosis not present

## 2022-03-17 DIAGNOSIS — Z170 Estrogen receptor positive status [ER+]: Secondary | ICD-10-CM | POA: Insufficient documentation

## 2022-03-17 NOTE — Telephone Encounter (Signed)
Scheduled appointment per 6/1 los. Patient is aware.

## 2022-03-17 NOTE — Progress Notes (Signed)
HEMATOLOGY-ONCOLOGY [place] VISIT PROGRESS NOTE  I connected with Jasmine Sosa on 03/17/2022 at  9:15 AM EDT by Mychart video conference and verified that I am speaking with the correct person using two identifiers.  I discussed the limitations, risks, security and privacy concerns of performing an evaluation and management service by Webex and the availability of in person appointments.  I also discussed with the patient that there may be a patient responsible charge related to this service. The patient expressed understanding and agreed to proceed.  Patient's Location: Home Physician Location: Clinic  CHIEF COMPLIANT: Recent hospitalization for Diverticulitis, uses CPAP  INTERVAL HISTORY: Jasmine Sosa is a 71 y.o. female with above-mentioned history of Rt Breast cancer on anastrozole therapy and tolerating it well. She was adm at [facility] for diverticulitis and got better.  Oncology History  Malignant neoplasm of upper-outer quadrant of right breast in female, estrogen receptor positive ([place])  08/23/2019 Initial Diagnosis   Routine screening mammogram detected 0.8cm mass in the 8:45 position of the right breast, no abnormal right axillary lymph nodes. 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    09/03/2019 Genetic Testing   Negative genetic testing:  No pathogenic variants identified on the [facility] Breast Cancer STAT panel or the Common Hereditary Cancers panel. The report date is 09/03/2019.  The Breast Cancer STAT panel offered by [facility] includes sequencing and rearrangement analysis for the following 9 genes:  ATM, BRCA1, BRCA2, CDH1, CHEK2, PALB2, PTEN, STK11 and TP53.  The Common Hereditary Cancers Panel offered by [facility] includes sequencing and/or deletion duplication testing of the following 48 genes: APC, ATM, AXIN2, BARD1, BMPR1A, BRCA1, BRCA2, BRIP1, CDH1, CDK4, CDKN2A (p14ARF), CDKN2A (p16INK4a),  CHEK2, CTNNA1, DICER1, EPCAM (Deletion/duplication testing only), GREM1 (promoter region deletion/duplication testing only), KIT, MEN1, MLH1, MSH2, MSH3, MSH6, MUTYH, NBN, NF1, NHTL1, PALB2, PDGFRA, PMS2, POLD1, POLE, PTEN, RAD50, RAD51C, RAD51D, RNF43, SDHB, SDHC, SDHD, SMAD4, SMARCA4. STK11, TP53, TSC1, TSC2, and VHL.  The following genes were evaluated for sequence changes only: SDHA and HOXB13 c.251G>A variant only.   09/18/2019 Surgery   Right lumpectomy [name]) [phone]): IDC, grade 1, 1.2cm, with low grade DCIS, clear margins, 2 right axillary lymph nodes negative.   09/18/2019 Cancer Staging   Staging form: Breast, AJCC 8th Edition - Pathologic stage from 09/18/2019: Stage IA (pT1c, pN0, cM0, G1, ER+, PR+, HER2-)   10/21/2019 Oncotype testing   The Oncotype DX score was 15 predicting a risk of outside the breast recurrence over the next 9 years of 4% if the patient's only systemic therapy is tamoxifen for 5 years.     Radiation Therapy   Radiation at [facility]   12/2019 - 12/2024 Anti-estrogen oral therapy   Anastrozole     REVIEW OF SYSTEMS:   Constitutional: Denies fevers, chills or abnormal weight loss All other systems were reviewed with the patient and are negative.  Observations/Objective:    I have reviewed the data as listed    Latest Ref Rng & Units 08/28/2019    8:25 AM  CMP  Glucose 70 - 99 mg/dL 91    BUN 8 - 23 mg/dL 16    Creatinine 0.44 - 1.00 mg/dL 0.79    Sodium 135 - 145 mmol/L 141    Potassium 3.5 - 5.1 mmol/L 3.8    Chloride 98 - 111 mmol/L 104    CO2 22 - 32 mmol/L 26    Calcium 8.9 -  10.3 mg/dL 9.7    Total Protein 6.5 - 8.1 g/dL 7.3    Total Bilirubin 0.3 - 1.2 mg/dL 0.4    Alkaline Phos 38 - 126 U/L 81    AST 15 - 41 U/L 16    ALT 0 - 44 U/L 13      Lab Results  Component Value Date   WBC 8.8 08/28/2019   HGB 13.3 08/28/2019   HCT 40.5 08/28/2019   MCV 91.2 08/28/2019   PLT 308 08/28/2019   NEUTROABS 4.6 08/28/2019       Assessment Plan:  Malignant neoplasm of upper-outer quadrant of right breast in female, estrogen receptor positive ([place]) 08/23/2019:Routine screening mammogram detected 0.8cm mass in the 8:45 position of the right breast, no abnormal right axillary lymph nodes. 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  T1b N0 stage Ia clinical stage   09/18/19: Right lumpectomy [name]): IDC, grade 1, 1.2cm, with low grade DCIS, clear margins, 2 right axillary lymph nodes negative.HER-2 - (1+), ER+ 90%, PR+ 85%, Ki67 5%.  T1cN0 Stage 1A Oncotype DX recurrence score 15: Low risk, 4% distant recurrence. Adjuvant radiation therapy  --------------------------------------------------------------------------------------------------------------- Current Treatment: Adjuvant antiestrogen therapy with anastrozole 1 mg daily started 12/17/19 (discontinued because of fixed drug eruption), switched to Letrozole   Toxicities: Tolerating letrozole extremely well since taking at bed time Hot flashes   Acute Diverticulitis May 2023 adm at [facility] (lost 9 lbs) Uses CPAP Hypothyroidism: doing better on Synthroid (brain fog is better)  Breast cancer surveillance: 1.  Mammogram at [facility] 09/01/2021: Benign, density Cat C 2. breast exam 03/17/2022: Benign   RTC in 1 year for follow up with Mychart virtual visit     I discussed the assessment and treatment plan with the patient. The patient was provided an opportunity to ask questions and all were answered. The patient agreed with the plan and demonstrated an understanding of the instructions. The patient was advised to call back or seek an in-person evaluation if the symptoms worsen or if the condition fails to improve as anticipated.   I provided 20 minutes of face-to-face Web Ex time during this encounter.    Rulon Eisenmenger, MD 03/17/2022

## 2022-03-17 NOTE — Assessment & Plan Note (Addendum)
08/23/2019:Routine screening mammogram detected 0.8cm mass in the 8:45 position of the right breast, no abnormal right axillary lymph nodes. 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 T1b N0 stage Ia clinical stage  09/18/19:Right lumpectomy [name]): IDC, grade 1, 1.2cm, with low grade DCIS, clear margins, 2 right axillary lymph nodes negative.HER-2 - (1+), ER+ 90%, PR+ 85%, Ki67 5%. T1cN0 Stage 1A Oncotype DX recurrence score 15: Low risk, 4% distant recurrence. Adjuvant radiation therapy  --------------------------------------------------------------------------------------------------------------- Current Treatment: Adjuvant antiestrogen therapy with anastrozole 1 mg daily started 12/17/19 (discontinued because of fixed drug eruption), switched to Letrozole  Toxicities: Tolerating letrozole extremely well since taking at bed time Hot flashes  Acute Diverticulitis May 2023 adm at [facility] (lost 9 lbs)  Breast cancer surveillance: 1.  Mammogram at [facility] 09/01/2021: Benign, density Cat C 2. breast exam 03/17/2022: Benign  RTC in 1 year for follow up with Mychart virtual visit

## 2022-03-23 ENCOUNTER — Telehealth: Payer: Medicare Other | Admitting: Hematology and Oncology

## 2022-04-12 ENCOUNTER — Telehealth: Payer: Medicare Other | Admitting: Hematology and Oncology

## 2023-01-09 ENCOUNTER — Telehealth: Payer: Self-pay | Admitting: Hematology and Oncology

## 2023-01-09 NOTE — Telephone Encounter (Signed)
Patient called to cancel upcoming appointment with Gudena.

## 2023-03-20 ENCOUNTER — Telehealth: Payer: Medicare Other | Admitting: Hematology and Oncology

## 2023-05-23 ENCOUNTER — Other Ambulatory Visit: Payer: Self-pay | Admitting: Hematology and Oncology

## 2023-05-23 ENCOUNTER — Telehealth: Payer: Self-pay

## 2023-05-23 NOTE — Telephone Encounter (Signed)
Called pt per refill request for letrozole. Pt has not seen Dr Pamelia Hoit since 03/2022. Called pt to advise she would need a f/u appt scheduled. Pt states she stopped letrozole the last day of February. Pt was offered a phone visit to discuss with MD and and f/u. Pt states, "No. I will not be coming back. I am just not interested in doing any of this anymore." Advised pt to call us back if she needs to be seen. She verbalized understanding.
